# Patient Record
Sex: Female | Born: 1950 | Race: Black or African American | Hispanic: No | Smoking: Never smoker
Health system: Southern US, Community
[De-identification: ages and names within clinical notes are randomized; demographics above are authoritative.]

## PROBLEM LIST (undated history)

## (undated) DIAGNOSIS — I10 Essential (primary) hypertension: Secondary | ICD-10-CM

## (undated) DIAGNOSIS — E119 Type 2 diabetes mellitus without complications: Secondary | ICD-10-CM

## (undated) DIAGNOSIS — G4730 Sleep apnea, unspecified: Secondary | ICD-10-CM

---

## 1999-05-31 HISTORY — PX: BREAST CYST ASPIRATION: SHX578

## 2015-05-31 HISTORY — PX: UMBILICAL HERNIA REPAIR: SHX196

## 2018-06-05 DIAGNOSIS — M5127 Other intervertebral disc displacement, lumbosacral region: Secondary | ICD-10-CM | POA: Diagnosis not present

## 2018-06-05 DIAGNOSIS — M5432 Sciatica, left side: Secondary | ICD-10-CM | POA: Diagnosis not present

## 2018-06-06 DIAGNOSIS — M9905 Segmental and somatic dysfunction of pelvic region: Secondary | ICD-10-CM | POA: Diagnosis not present

## 2018-06-06 DIAGNOSIS — M5432 Sciatica, left side: Secondary | ICD-10-CM | POA: Diagnosis not present

## 2018-06-06 DIAGNOSIS — M9903 Segmental and somatic dysfunction of lumbar region: Secondary | ICD-10-CM | POA: Diagnosis not present

## 2018-06-06 DIAGNOSIS — M5416 Radiculopathy, lumbar region: Secondary | ICD-10-CM | POA: Diagnosis not present

## 2018-06-07 DIAGNOSIS — M5416 Radiculopathy, lumbar region: Secondary | ICD-10-CM | POA: Diagnosis not present

## 2018-06-07 DIAGNOSIS — M5432 Sciatica, left side: Secondary | ICD-10-CM | POA: Diagnosis not present

## 2018-06-07 DIAGNOSIS — M9905 Segmental and somatic dysfunction of pelvic region: Secondary | ICD-10-CM | POA: Diagnosis not present

## 2018-06-07 DIAGNOSIS — M9903 Segmental and somatic dysfunction of lumbar region: Secondary | ICD-10-CM | POA: Diagnosis not present

## 2018-06-08 DIAGNOSIS — M5417 Radiculopathy, lumbosacral region: Secondary | ICD-10-CM | POA: Diagnosis not present

## 2018-06-08 DIAGNOSIS — M9903 Segmental and somatic dysfunction of lumbar region: Secondary | ICD-10-CM | POA: Diagnosis not present

## 2018-06-25 DIAGNOSIS — M6281 Muscle weakness (generalized): Secondary | ICD-10-CM | POA: Diagnosis not present

## 2018-06-25 DIAGNOSIS — M5432 Sciatica, left side: Secondary | ICD-10-CM | POA: Diagnosis not present

## 2018-07-06 DIAGNOSIS — M5432 Sciatica, left side: Secondary | ICD-10-CM | POA: Diagnosis not present

## 2018-07-16 ENCOUNTER — Other Ambulatory Visit: Payer: Self-pay | Admitting: Family Medicine

## 2018-07-16 DIAGNOSIS — E785 Hyperlipidemia, unspecified: Secondary | ICD-10-CM | POA: Diagnosis not present

## 2018-07-16 DIAGNOSIS — Z1231 Encounter for screening mammogram for malignant neoplasm of breast: Secondary | ICD-10-CM

## 2018-07-16 DIAGNOSIS — Z23 Encounter for immunization: Secondary | ICD-10-CM | POA: Diagnosis not present

## 2018-07-16 DIAGNOSIS — Z862 Personal history of diseases of the blood and blood-forming organs and certain disorders involving the immune mechanism: Secondary | ICD-10-CM | POA: Diagnosis not present

## 2018-07-16 DIAGNOSIS — Z0000 Encounter for general adult medical examination without abnormal findings: Secondary | ICD-10-CM | POA: Diagnosis not present

## 2018-07-16 DIAGNOSIS — E1169 Type 2 diabetes mellitus with other specified complication: Secondary | ICD-10-CM | POA: Diagnosis not present

## 2018-07-16 DIAGNOSIS — M5432 Sciatica, left side: Secondary | ICD-10-CM | POA: Diagnosis not present

## 2018-07-16 DIAGNOSIS — E7800 Pure hypercholesterolemia, unspecified: Secondary | ICD-10-CM | POA: Diagnosis not present

## 2018-07-16 DIAGNOSIS — I10 Essential (primary) hypertension: Secondary | ICD-10-CM | POA: Diagnosis not present

## 2018-07-16 DIAGNOSIS — M6281 Muscle weakness (generalized): Secondary | ICD-10-CM | POA: Diagnosis not present

## 2018-07-24 DIAGNOSIS — M6281 Muscle weakness (generalized): Secondary | ICD-10-CM | POA: Diagnosis not present

## 2018-07-24 DIAGNOSIS — Z780 Asymptomatic menopausal state: Secondary | ICD-10-CM | POA: Diagnosis not present

## 2018-07-24 DIAGNOSIS — M5432 Sciatica, left side: Secondary | ICD-10-CM | POA: Diagnosis not present

## 2018-07-31 ENCOUNTER — Encounter: Payer: Self-pay | Admitting: Radiology

## 2018-07-31 ENCOUNTER — Ambulatory Visit
Admission: RE | Admit: 2018-07-31 | Discharge: 2018-07-31 | Disposition: A | Discharge status: Home / Self Care | Payer: Medicare HMO | Source: Ambulatory Visit | Attending: Family Medicine | Admitting: Family Medicine

## 2018-07-31 DIAGNOSIS — M6281 Muscle weakness (generalized): Secondary | ICD-10-CM | POA: Diagnosis not present

## 2018-07-31 DIAGNOSIS — M5432 Sciatica, left side: Secondary | ICD-10-CM | POA: Diagnosis not present

## 2018-07-31 DIAGNOSIS — Z1231 Encounter for screening mammogram for malignant neoplasm of breast: Secondary | ICD-10-CM | POA: Insufficient documentation

## 2018-07-31 DIAGNOSIS — L299 Pruritus, unspecified: Secondary | ICD-10-CM | POA: Diagnosis not present

## 2018-07-31 IMAGING — MG DIGITAL SCREENING BILATERAL MAMMOGRAM WITH TOMO AND CAD
6 of 10 series · 6 of 30 positions shown · non-contrast
Comparison: Previous exam(s).

CLINICAL DATA: Screening.

EXAM:
DIGITAL SCREENING BILATERAL MAMMOGRAM WITH TOMO AND CAD

[L MLO synth-2D (1 of 2)]
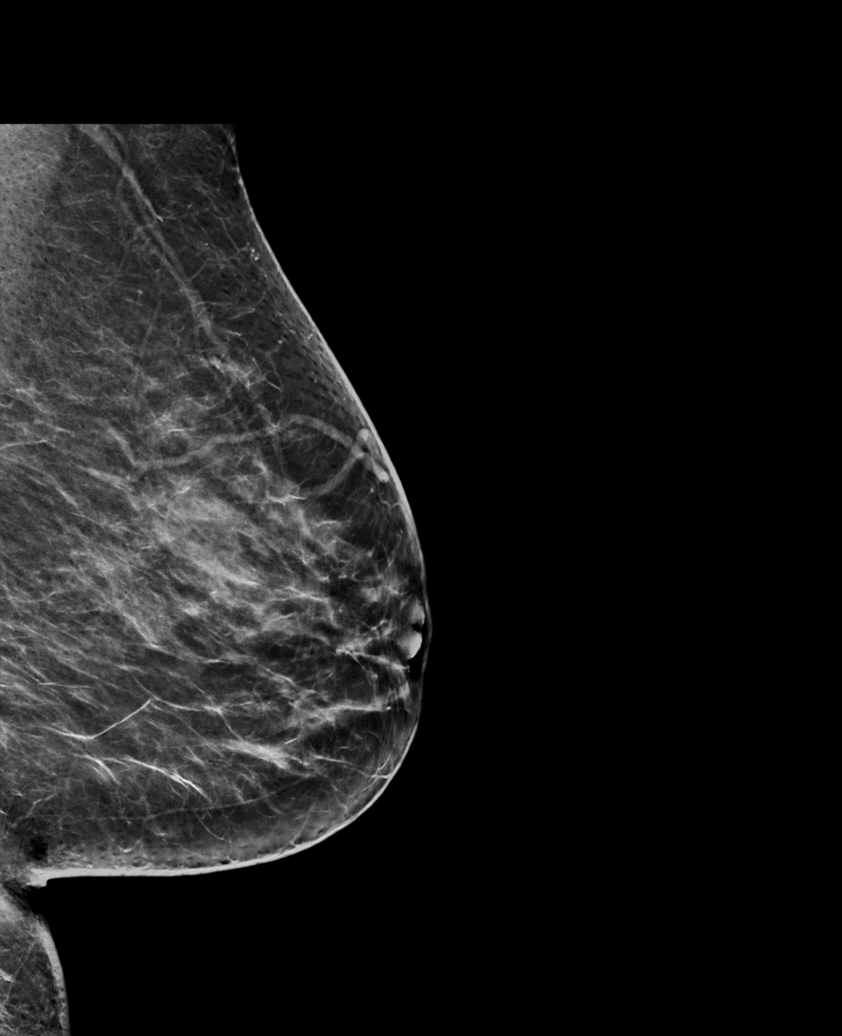

[L CC synth-2D]
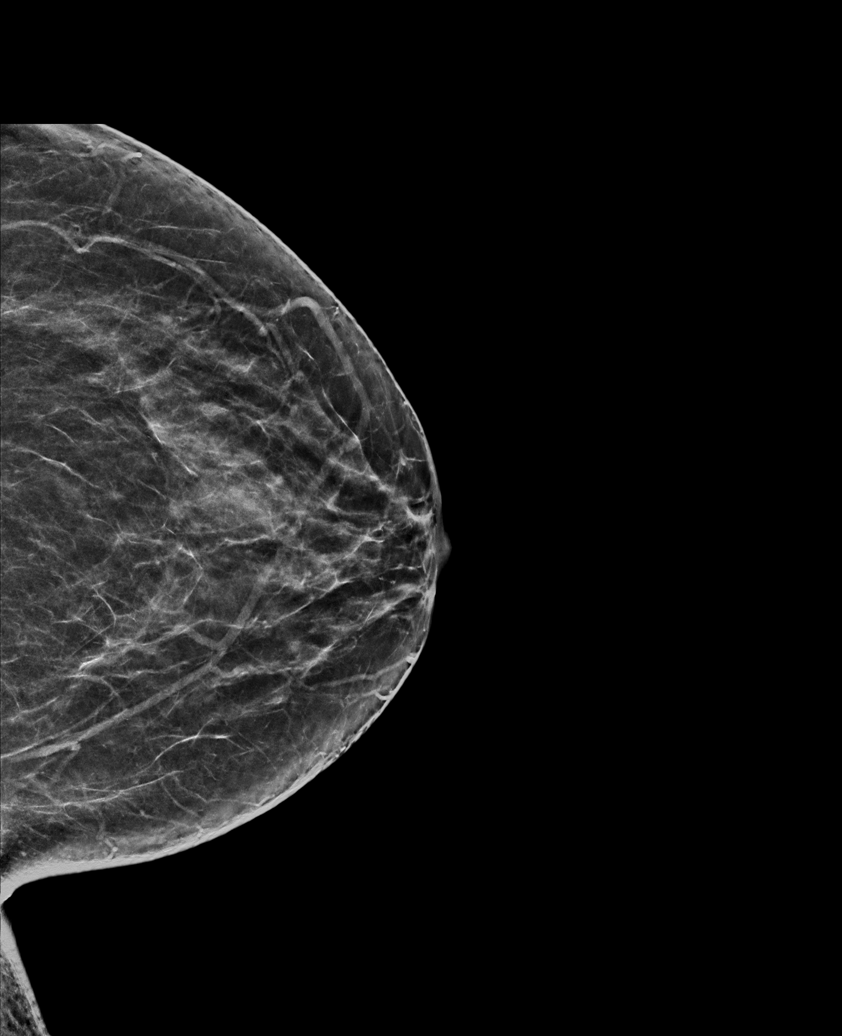

[L MLO synth-2D (2 of 2)]
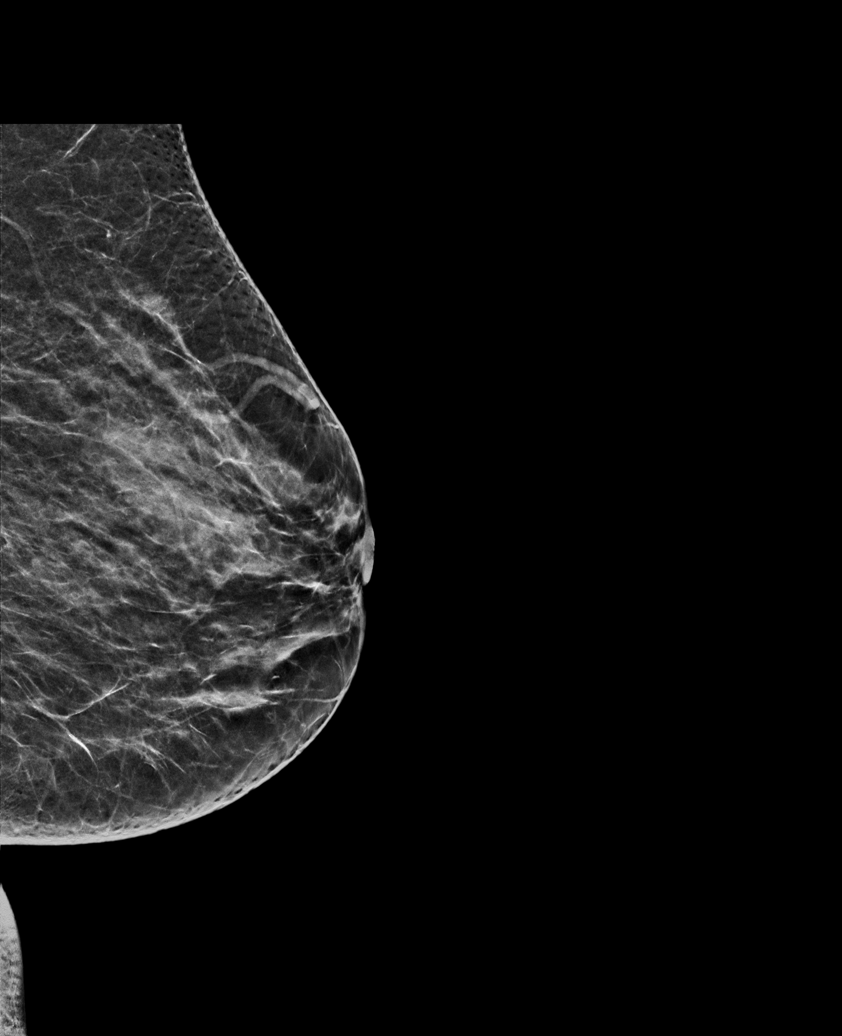

[R MLO synth-2D]
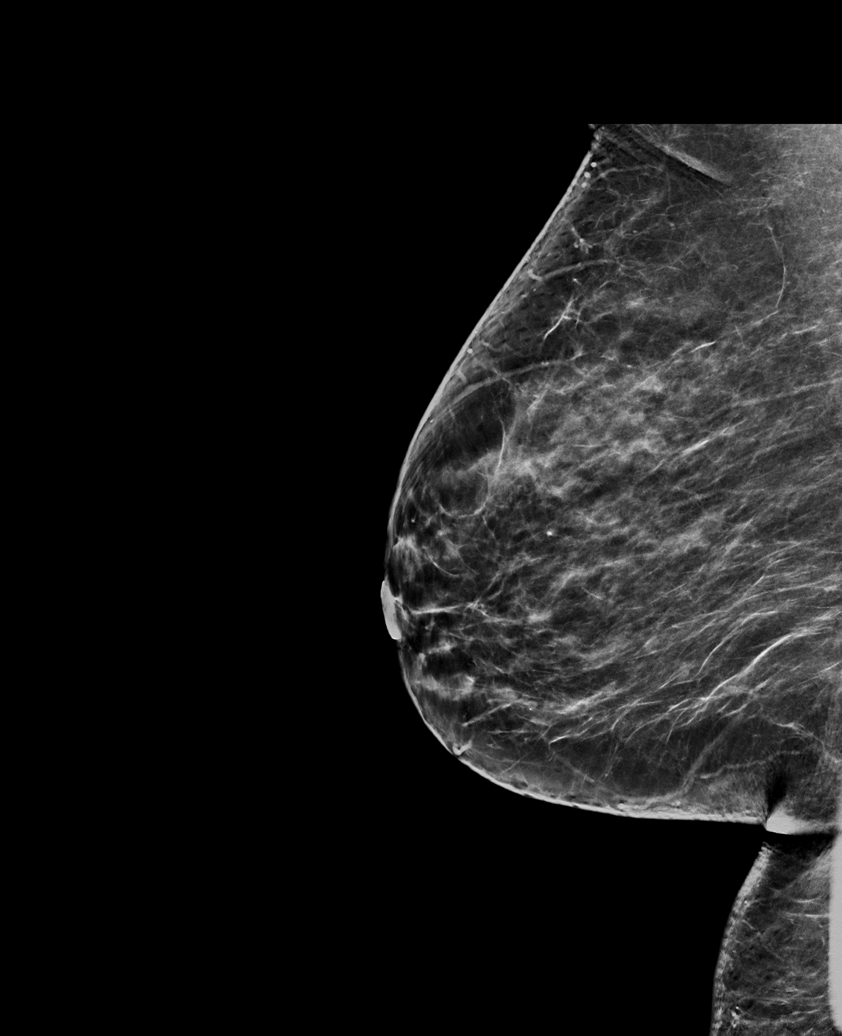

[R CC synth-2D]
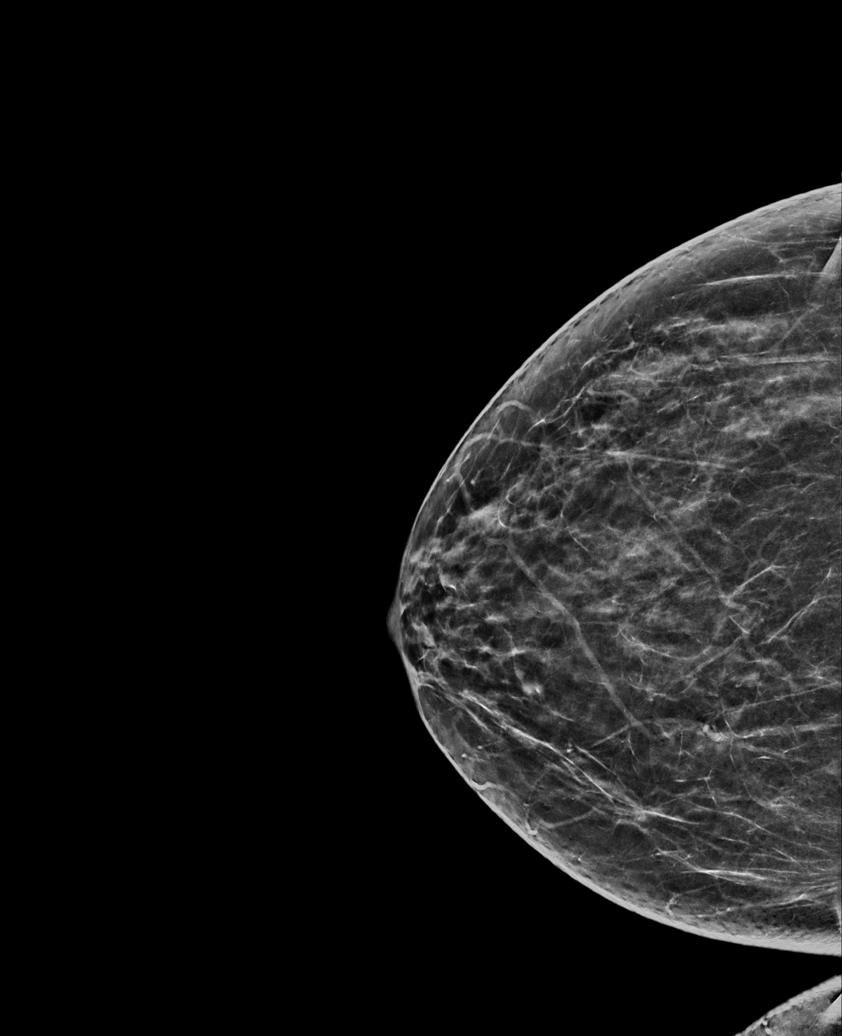

[L CC tomo · tomo slice 31/61.0]
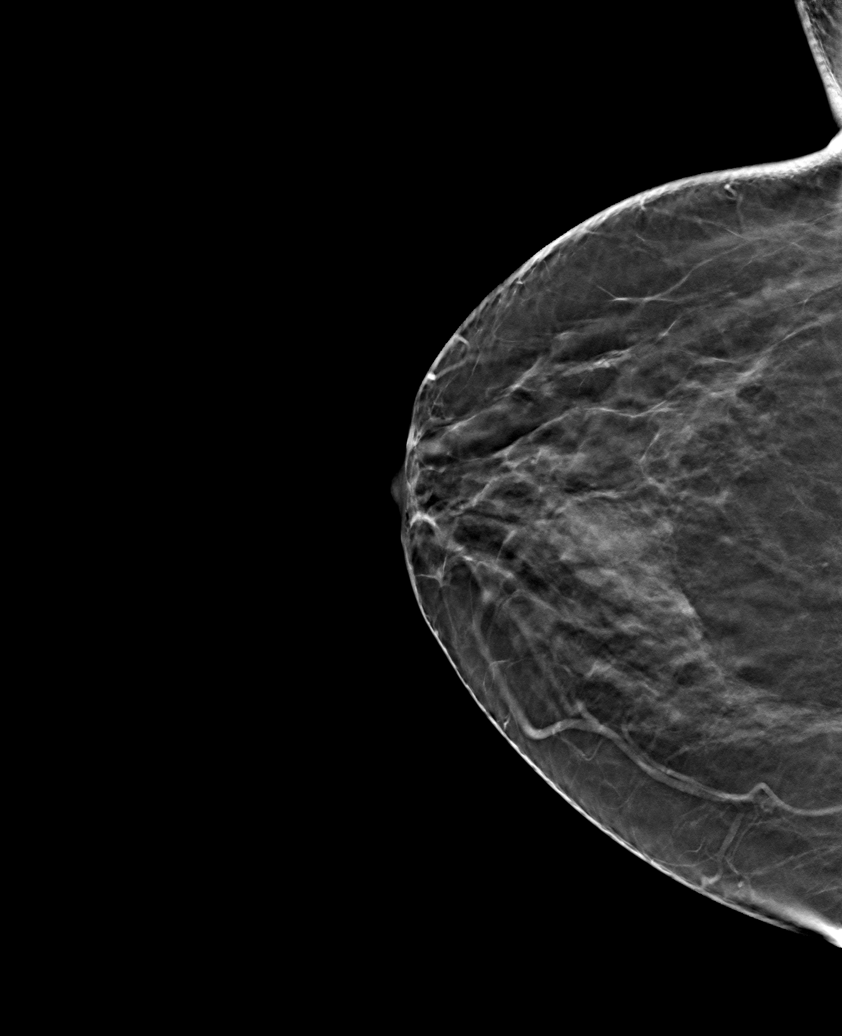

[6 of 30 positions shown; findings below may reference images not displayed]

ACR Breast Density Category c: The breast tissue is heterogeneously
dense, which may obscure small masses.
FINDINGS: There are no findings suspicious for malignancy. Images were
processed with CAD.
IMPRESSION: No mammographic evidence of malignancy. A result letter of this
screening mammogram will be mailed directly to the patient.

RECOMMENDATION:
Screening mammogram in one year. (Code:[5V])

BI-RADS CATEGORY  1: Negative.

## 2018-10-18 DIAGNOSIS — H40003 Preglaucoma, unspecified, bilateral: Secondary | ICD-10-CM | POA: Diagnosis not present

## 2018-11-07 DIAGNOSIS — E7800 Pure hypercholesterolemia, unspecified: Secondary | ICD-10-CM | POA: Diagnosis not present

## 2018-11-07 DIAGNOSIS — Z862 Personal history of diseases of the blood and blood-forming organs and certain disorders involving the immune mechanism: Secondary | ICD-10-CM | POA: Diagnosis not present

## 2018-11-07 DIAGNOSIS — I10 Essential (primary) hypertension: Secondary | ICD-10-CM | POA: Diagnosis not present

## 2018-11-07 DIAGNOSIS — E1169 Type 2 diabetes mellitus with other specified complication: Secondary | ICD-10-CM | POA: Diagnosis not present

## 2018-11-07 DIAGNOSIS — E785 Hyperlipidemia, unspecified: Secondary | ICD-10-CM | POA: Diagnosis not present

## 2018-11-14 DIAGNOSIS — E785 Hyperlipidemia, unspecified: Secondary | ICD-10-CM | POA: Diagnosis not present

## 2018-11-14 DIAGNOSIS — I10 Essential (primary) hypertension: Secondary | ICD-10-CM | POA: Diagnosis not present

## 2018-11-14 DIAGNOSIS — Z862 Personal history of diseases of the blood and blood-forming organs and certain disorders involving the immune mechanism: Secondary | ICD-10-CM | POA: Diagnosis not present

## 2018-11-14 DIAGNOSIS — E1169 Type 2 diabetes mellitus with other specified complication: Secondary | ICD-10-CM | POA: Diagnosis not present

## 2018-11-14 DIAGNOSIS — E7800 Pure hypercholesterolemia, unspecified: Secondary | ICD-10-CM | POA: Diagnosis not present

## 2019-01-28 DIAGNOSIS — E1169 Type 2 diabetes mellitus with other specified complication: Secondary | ICD-10-CM | POA: Diagnosis not present

## 2019-01-28 DIAGNOSIS — M5489 Other dorsalgia: Secondary | ICD-10-CM | POA: Diagnosis not present

## 2019-01-28 DIAGNOSIS — E785 Hyperlipidemia, unspecified: Secondary | ICD-10-CM | POA: Diagnosis not present

## 2019-01-28 DIAGNOSIS — M546 Pain in thoracic spine: Secondary | ICD-10-CM | POA: Diagnosis not present

## 2019-01-28 DIAGNOSIS — K625 Hemorrhage of anus and rectum: Secondary | ICD-10-CM | POA: Diagnosis not present

## 2019-02-06 DIAGNOSIS — K625 Hemorrhage of anus and rectum: Secondary | ICD-10-CM | POA: Diagnosis not present

## 2019-02-06 DIAGNOSIS — Z01812 Encounter for preprocedural laboratory examination: Secondary | ICD-10-CM | POA: Diagnosis not present

## 2019-03-11 DIAGNOSIS — D709 Neutropenia, unspecified: Secondary | ICD-10-CM | POA: Diagnosis not present

## 2019-04-18 DIAGNOSIS — H40003 Preglaucoma, unspecified, bilateral: Secondary | ICD-10-CM | POA: Diagnosis not present

## 2019-05-20 DIAGNOSIS — Z862 Personal history of diseases of the blood and blood-forming organs and certain disorders involving the immune mechanism: Secondary | ICD-10-CM | POA: Diagnosis not present

## 2019-05-20 DIAGNOSIS — E785 Hyperlipidemia, unspecified: Secondary | ICD-10-CM | POA: Diagnosis not present

## 2019-05-20 DIAGNOSIS — E1169 Type 2 diabetes mellitus with other specified complication: Secondary | ICD-10-CM | POA: Diagnosis not present

## 2019-05-20 DIAGNOSIS — E7800 Pure hypercholesterolemia, unspecified: Secondary | ICD-10-CM | POA: Diagnosis not present

## 2019-05-20 DIAGNOSIS — I10 Essential (primary) hypertension: Secondary | ICD-10-CM | POA: Diagnosis not present

## 2019-05-29 DIAGNOSIS — Z87891 Personal history of nicotine dependence: Secondary | ICD-10-CM | POA: Diagnosis not present

## 2019-05-29 DIAGNOSIS — Z0000 Encounter for general adult medical examination without abnormal findings: Secondary | ICD-10-CM | POA: Diagnosis not present

## 2019-06-27 ENCOUNTER — Other Ambulatory Visit: Payer: Self-pay | Admitting: Family Medicine

## 2019-06-27 DIAGNOSIS — Z1231 Encounter for screening mammogram for malignant neoplasm of breast: Secondary | ICD-10-CM

## 2019-08-01 ENCOUNTER — Ambulatory Visit
Admission: RE | Admit: 2019-08-01 | Discharge: 2019-08-01 | Disposition: A | Discharge status: Home / Self Care | Payer: Medicare HMO | Source: Ambulatory Visit | Attending: Family Medicine | Admitting: Family Medicine

## 2019-08-01 DIAGNOSIS — Z1231 Encounter for screening mammogram for malignant neoplasm of breast: Secondary | ICD-10-CM | POA: Diagnosis not present

## 2019-08-01 IMAGING — MG DIGITAL SCREENING BILAT W/ TOMO W/ CAD
8 series · 9 of 24 positions shown · non-contrast
Comparison: Previous exam(s).

CLINICAL DATA: Screening.

EXAM:
DIGITAL SCREENING BILATERAL MAMMOGRAM WITH TOMO AND CAD

[L CC synth-2D]
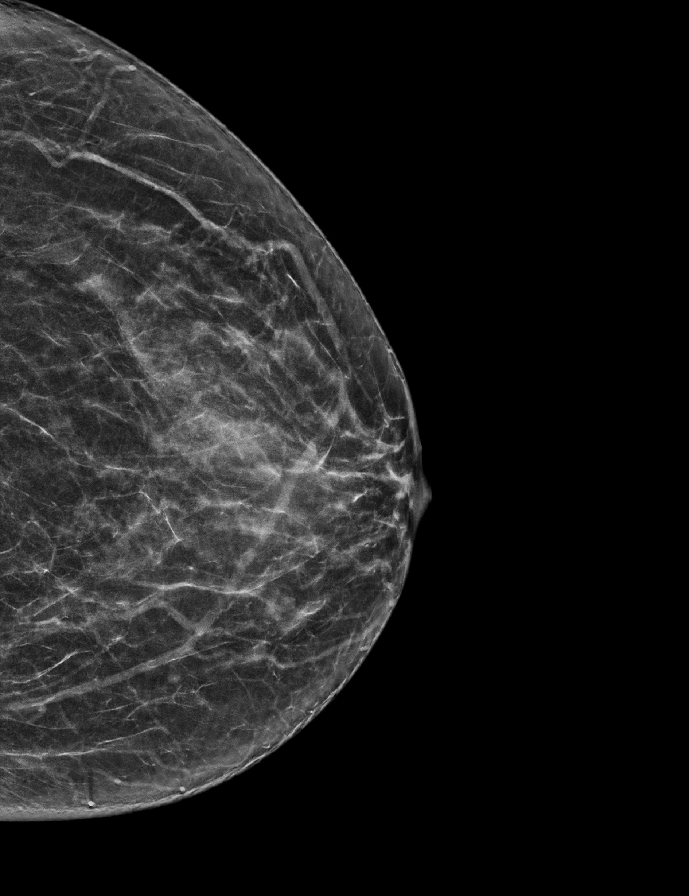

[R CC synth-2D]
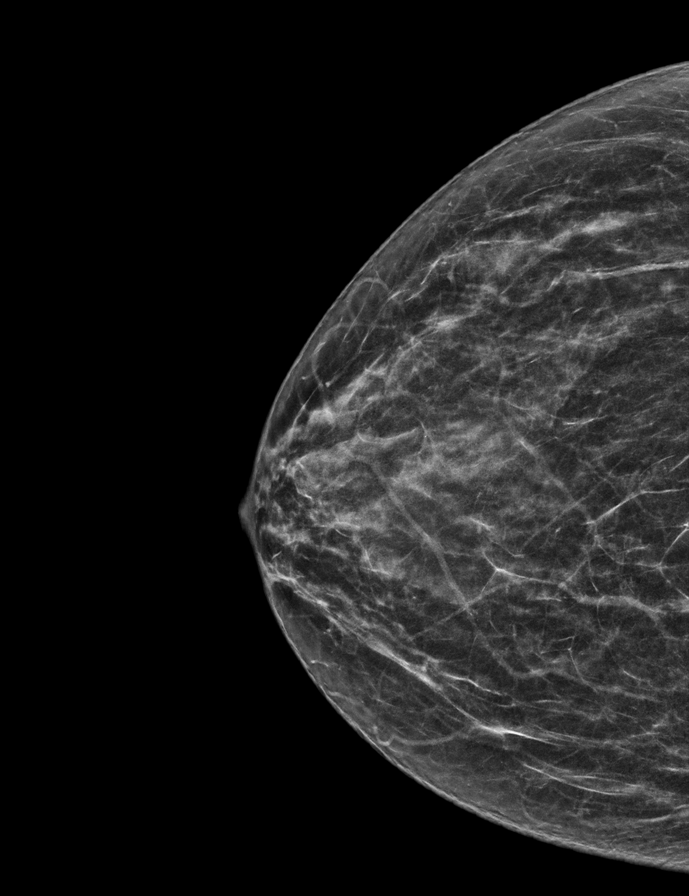

[R MLO synth-2D]
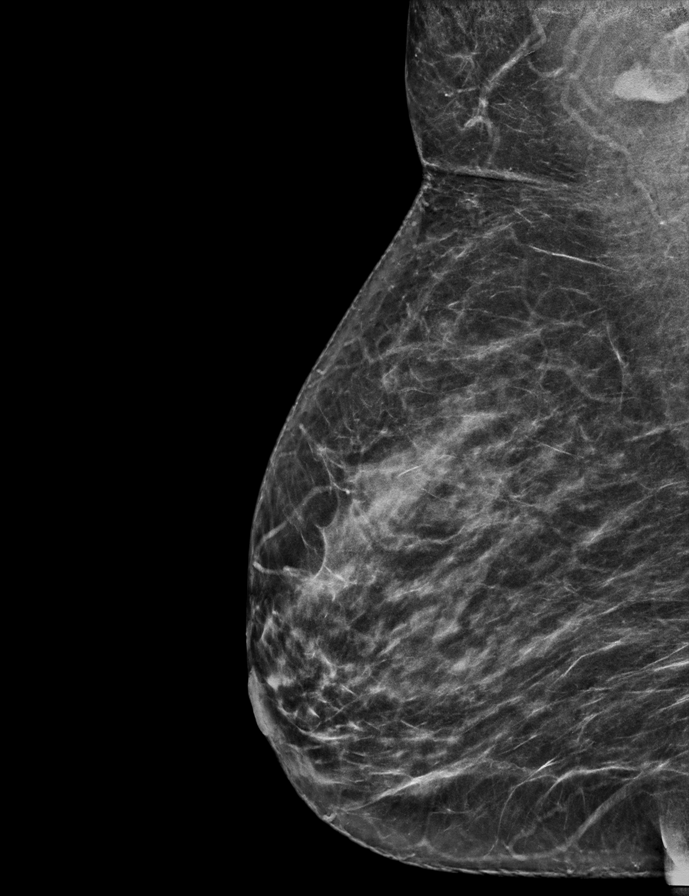

[L MLO synth-2D]
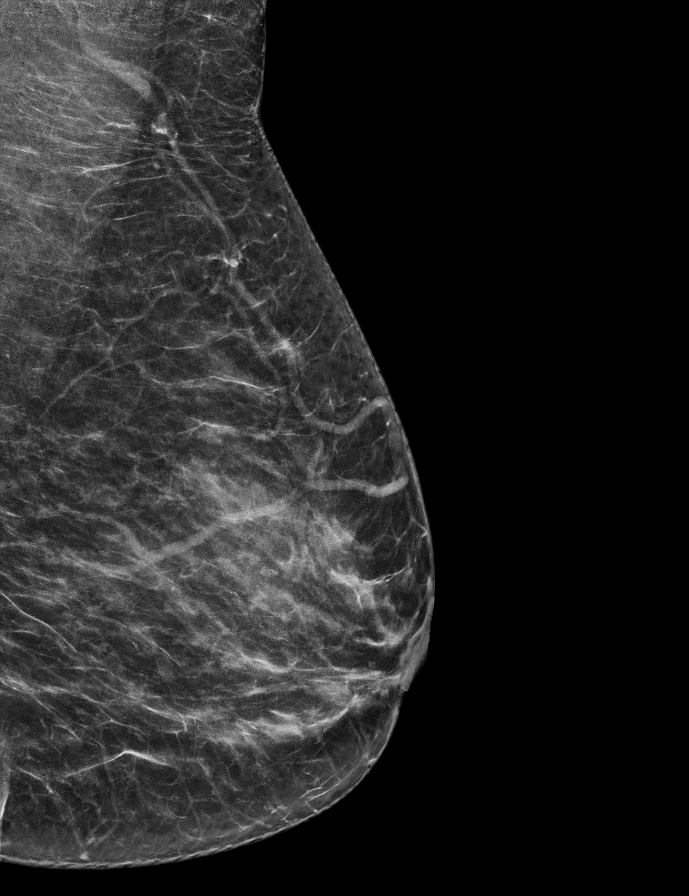

[L CC tomo · 2 of 55 frames shown]
[frame 18/55]
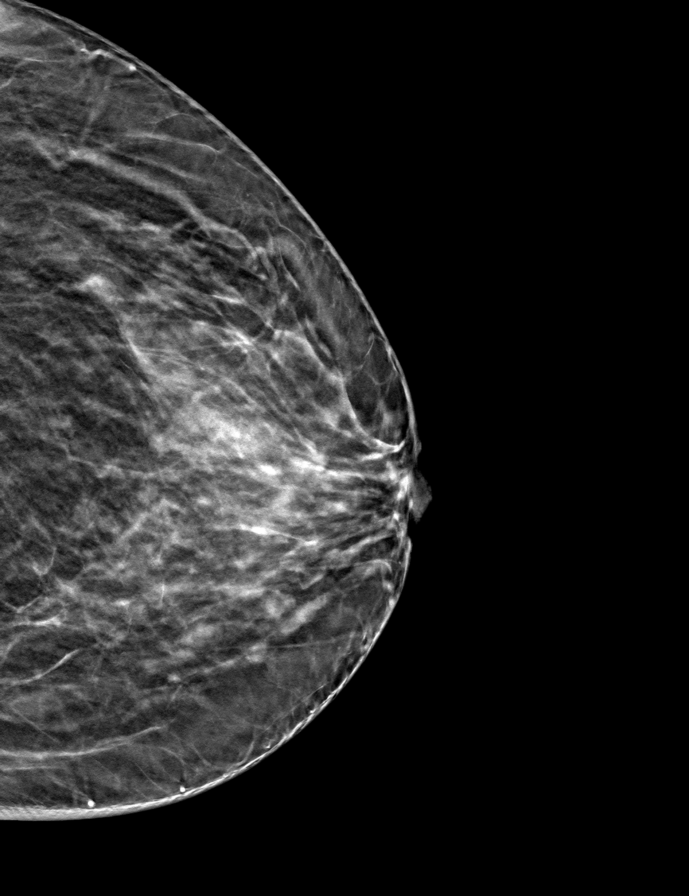
[frame 28/55]
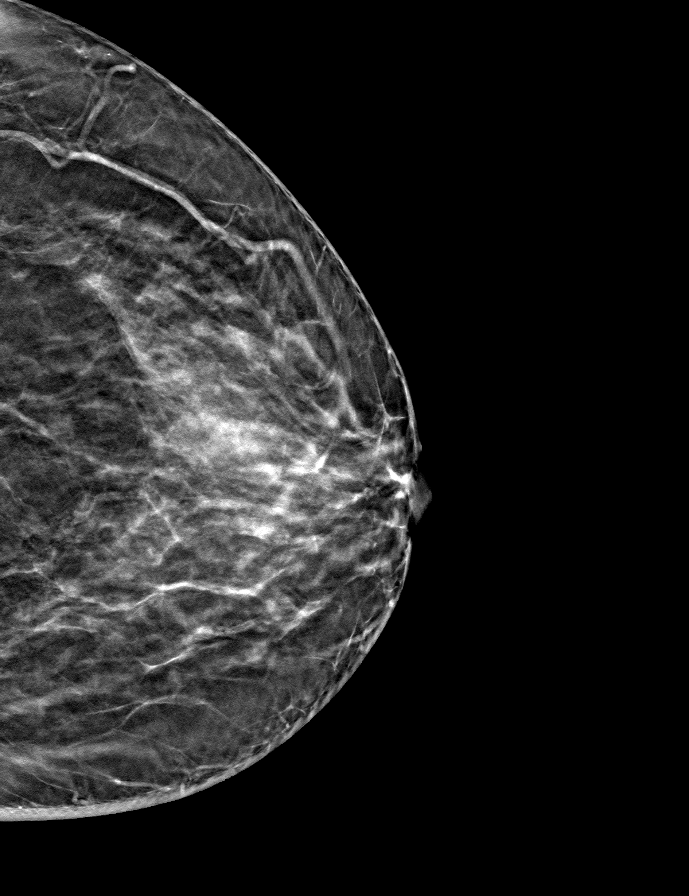

[R CC tomo · tomo slice 27/52.0]
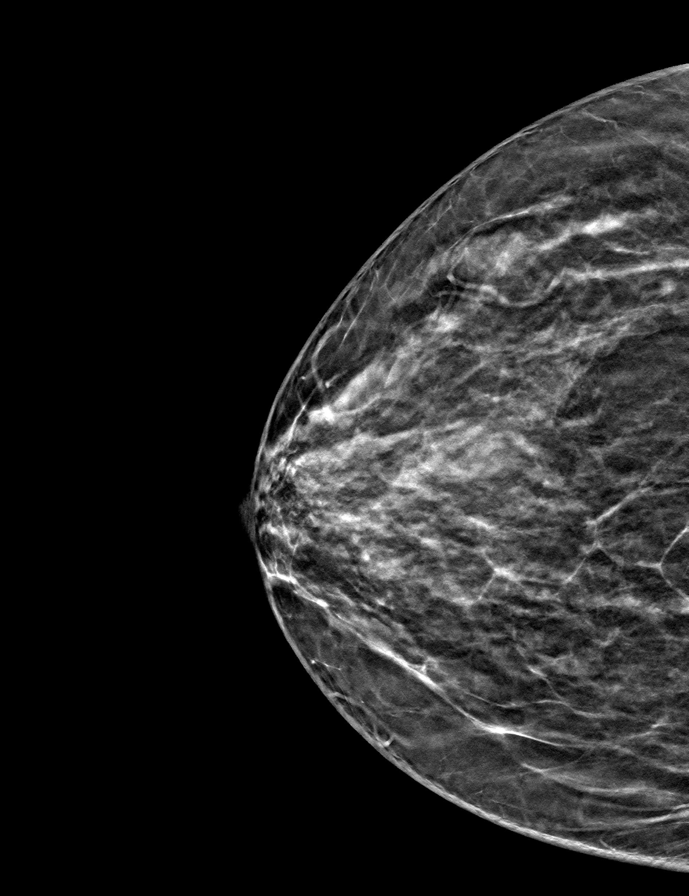

[L MLO tomo · tomo slice 30/59.0]
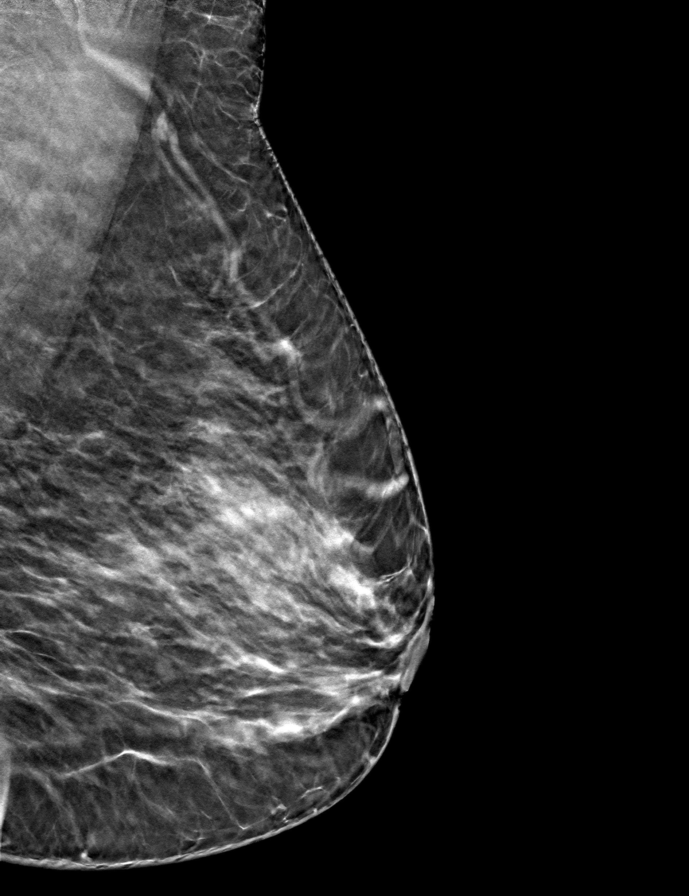

[R MLO tomo · tomo slice 31/62.0]
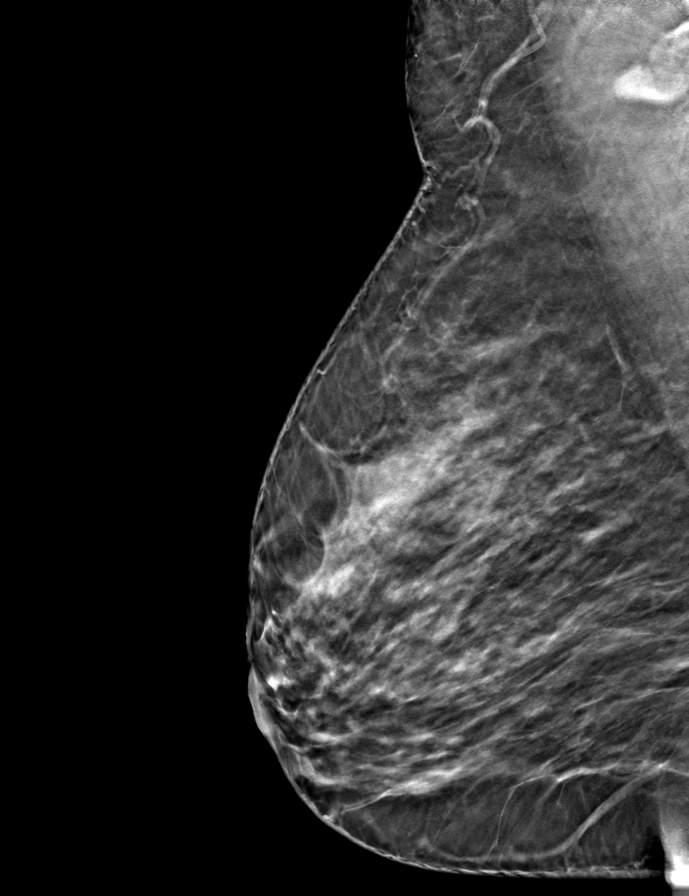

[9 of 24 positions shown; findings below may reference images not displayed]

ACR Breast Density Category c: The breast tissue is heterogeneously
dense, which may obscure small masses.
FINDINGS: There are no findings suspicious for malignancy. Images were
processed with CAD.
IMPRESSION: No mammographic evidence of malignancy. A result letter of this
screening mammogram will be mailed directly to the patient.

RECOMMENDATION:
Screening mammogram in one year. (Code:[5V])

BI-RADS CATEGORY  1: Negative.

## 2019-09-23 DIAGNOSIS — Z0100 Encounter for examination of eyes and vision without abnormal findings: Secondary | ICD-10-CM | POA: Diagnosis not present

## 2019-10-08 DIAGNOSIS — H40003 Preglaucoma, unspecified, bilateral: Secondary | ICD-10-CM | POA: Diagnosis not present

## 2019-10-15 DIAGNOSIS — H40003 Preglaucoma, unspecified, bilateral: Secondary | ICD-10-CM | POA: Diagnosis not present

## 2019-11-21 DIAGNOSIS — E7800 Pure hypercholesterolemia, unspecified: Secondary | ICD-10-CM | POA: Diagnosis not present

## 2019-11-21 DIAGNOSIS — I10 Essential (primary) hypertension: Secondary | ICD-10-CM | POA: Diagnosis not present

## 2019-11-21 DIAGNOSIS — E1169 Type 2 diabetes mellitus with other specified complication: Secondary | ICD-10-CM | POA: Diagnosis not present

## 2019-11-21 DIAGNOSIS — E785 Hyperlipidemia, unspecified: Secondary | ICD-10-CM | POA: Diagnosis not present

## 2019-11-28 DIAGNOSIS — E7800 Pure hypercholesterolemia, unspecified: Secondary | ICD-10-CM | POA: Diagnosis not present

## 2019-11-28 DIAGNOSIS — M545 Low back pain: Secondary | ICD-10-CM | POA: Diagnosis not present

## 2019-11-28 DIAGNOSIS — G8929 Other chronic pain: Secondary | ICD-10-CM | POA: Diagnosis not present

## 2019-11-28 DIAGNOSIS — E1169 Type 2 diabetes mellitus with other specified complication: Secondary | ICD-10-CM | POA: Diagnosis not present

## 2019-11-28 DIAGNOSIS — M2012 Hallux valgus (acquired), left foot: Secondary | ICD-10-CM | POA: Diagnosis not present

## 2019-11-28 DIAGNOSIS — I10 Essential (primary) hypertension: Secondary | ICD-10-CM | POA: Diagnosis not present

## 2019-11-28 DIAGNOSIS — E119 Type 2 diabetes mellitus without complications: Secondary | ICD-10-CM | POA: Diagnosis not present

## 2019-11-28 DIAGNOSIS — L659 Nonscarring hair loss, unspecified: Secondary | ICD-10-CM | POA: Diagnosis not present

## 2019-11-28 DIAGNOSIS — R1033 Periumbilical pain: Secondary | ICD-10-CM | POA: Diagnosis not present

## 2019-11-28 DIAGNOSIS — M778 Other enthesopathies, not elsewhere classified: Secondary | ICD-10-CM | POA: Diagnosis not present

## 2019-11-28 DIAGNOSIS — D649 Anemia, unspecified: Secondary | ICD-10-CM | POA: Diagnosis not present

## 2019-11-28 DIAGNOSIS — D3613 Benign neoplasm of peripheral nerves and autonomic nervous system of lower limb, including hip: Secondary | ICD-10-CM | POA: Diagnosis not present

## 2019-11-28 DIAGNOSIS — D72819 Decreased white blood cell count, unspecified: Secondary | ICD-10-CM | POA: Diagnosis not present

## 2019-11-28 DIAGNOSIS — M898X9 Other specified disorders of bone, unspecified site: Secondary | ICD-10-CM | POA: Diagnosis not present

## 2019-12-12 ENCOUNTER — Other Ambulatory Visit (HOSPITAL_COMMUNITY): Payer: Self-pay | Admitting: General Surgery

## 2019-12-12 ENCOUNTER — Other Ambulatory Visit: Payer: Self-pay | Admitting: General Surgery

## 2019-12-12 DIAGNOSIS — Z8719 Personal history of other diseases of the digestive system: Secondary | ICD-10-CM | POA: Diagnosis not present

## 2019-12-12 DIAGNOSIS — R1033 Periumbilical pain: Secondary | ICD-10-CM | POA: Diagnosis not present

## 2019-12-12 DIAGNOSIS — Z9889 Other specified postprocedural states: Secondary | ICD-10-CM | POA: Diagnosis not present

## 2019-12-26 ENCOUNTER — Other Ambulatory Visit: Payer: Self-pay

## 2019-12-26 ENCOUNTER — Ambulatory Visit
Admission: RE | Admit: 2019-12-26 | Discharge: 2019-12-26 | Disposition: A | Discharge status: Home / Self Care | Payer: Medicare HMO | Source: Ambulatory Visit | Attending: General Surgery | Admitting: General Surgery

## 2019-12-26 DIAGNOSIS — R1033 Periumbilical pain: Secondary | ICD-10-CM | POA: Insufficient documentation

## 2019-12-26 DIAGNOSIS — K449 Diaphragmatic hernia without obstruction or gangrene: Secondary | ICD-10-CM | POA: Diagnosis not present

## 2019-12-26 HISTORY — DX: Essential (primary) hypertension: I10

## 2019-12-26 LAB — POCT I-STAT CREATININE: Creatinine, Ser: 0.7 mg/dL (ref 0.44–1.00)

## 2019-12-26 IMAGING — CT CT ABD-PELV W/ CM
2 of 5 series · 16 of 46 positions shown, 18 images · IV contrast (omnipaque)
Comparison: None.

CLINICAL DATA: Periumbilical abdominal pain

EXAM:
CT ABDOMEN AND PELVIS WITH CONTRAST
TECHNIQUE: Multidetector CT imaging of the abdomen and pelvis was performed
using the standard protocol following bolus administration of
intravenous contrast.
CONTRAST:  100mL OMNIPAQUE IOHEXOL 300 MG/ML  SOLN

[Series 2: abd pelvis 5.00 · axial · 0.65mm/px · z∈[+1326,+1741]mm · 13 of 93 slices shown, 15 images]
[im 5/93  soft-tissue]
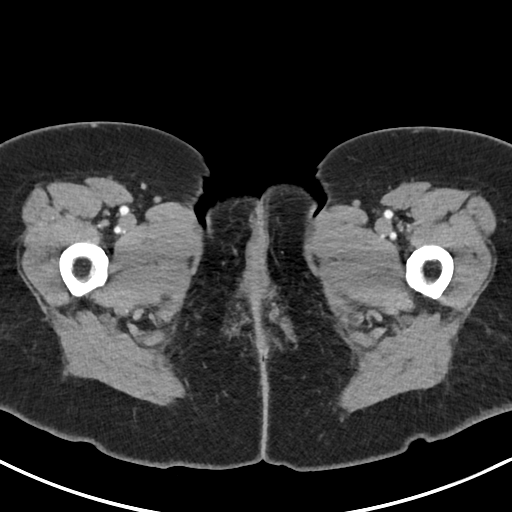
[im 5/93  bone]
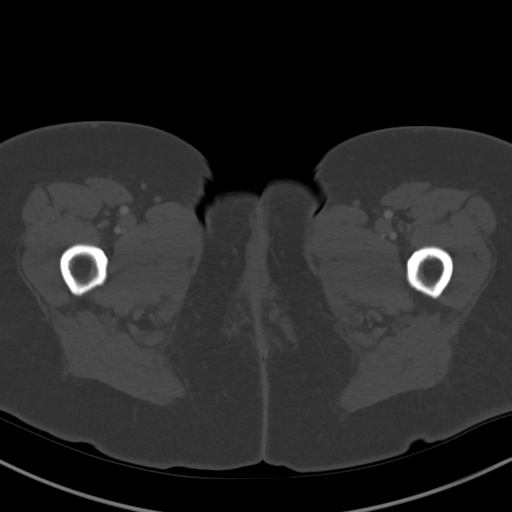
[im 15/93  soft-tissue]
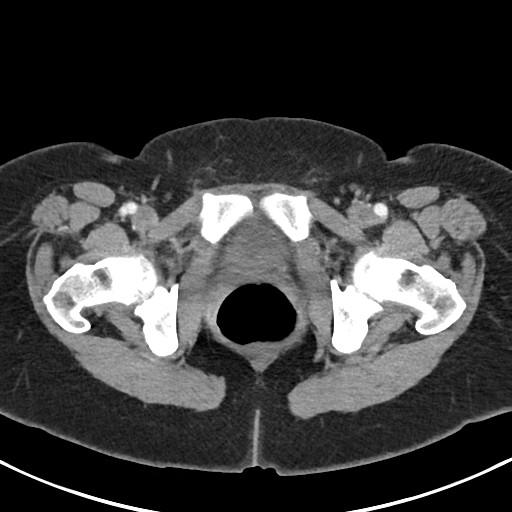
[im 20/93  soft-tissue]
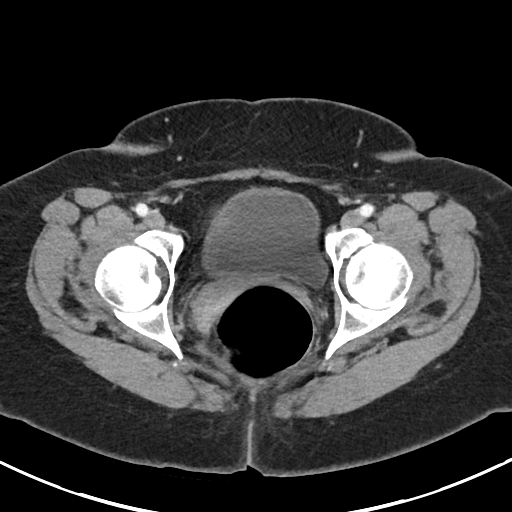
[im 25/93  soft-tissue]
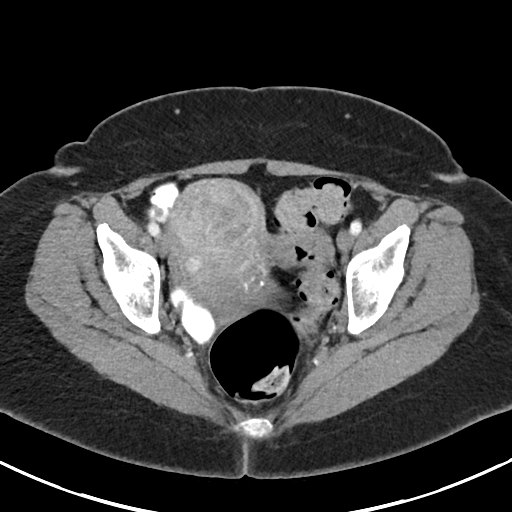
[im 34/93  soft-tissue]
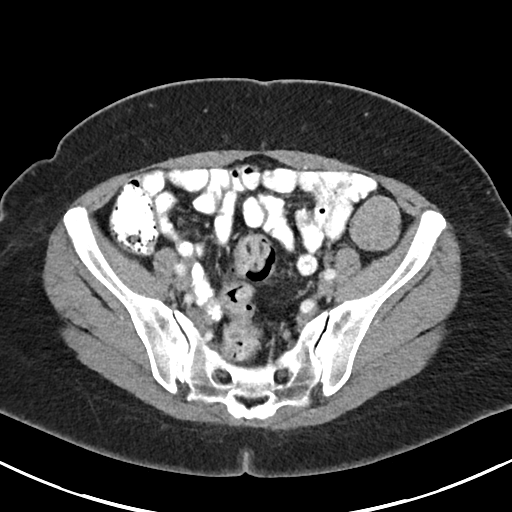
[im 39/93  soft-tissue]
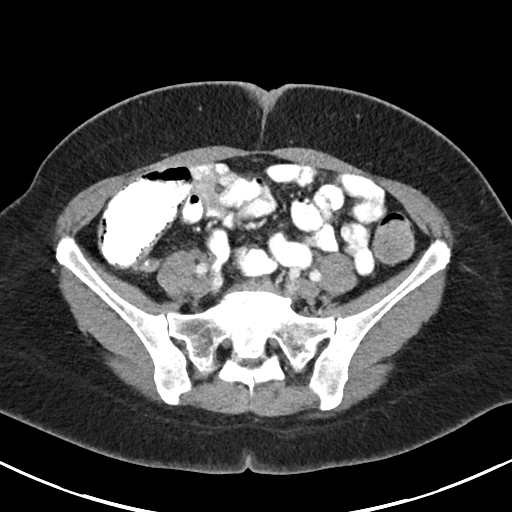
[im 49/93  soft-tissue]
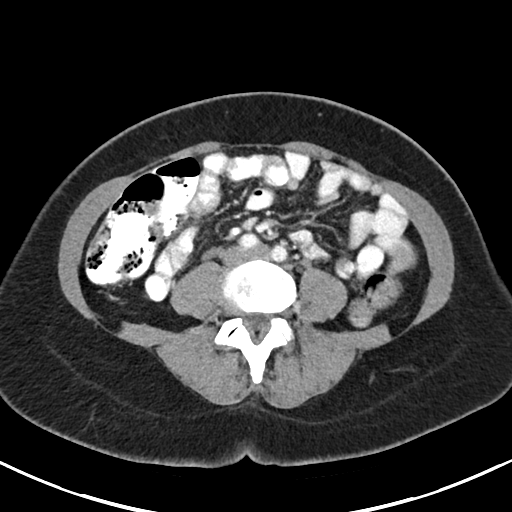
[im 54/93  soft-tissue]
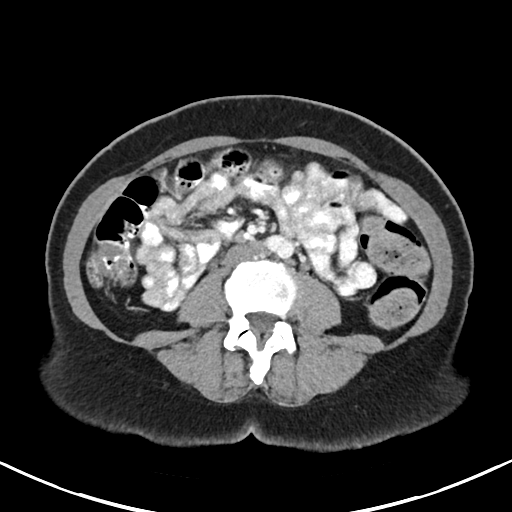
[im 59/93  soft-tissue]
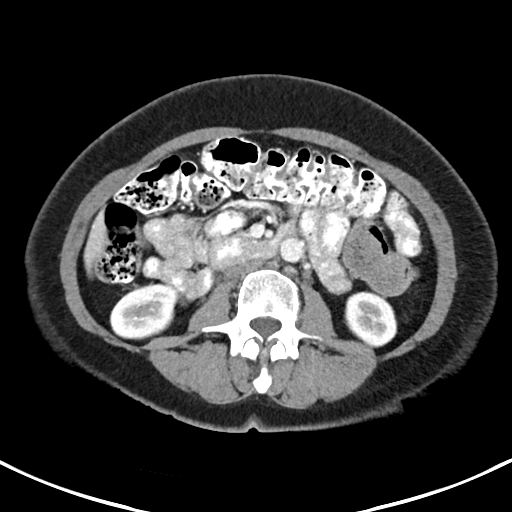
[im 59/93  bone]
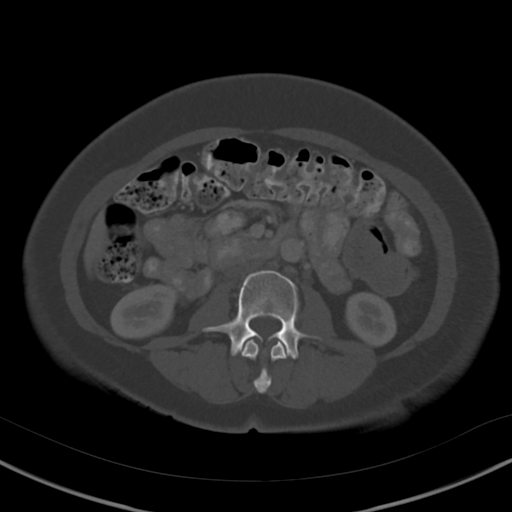
[im 68/93  soft-tissue]
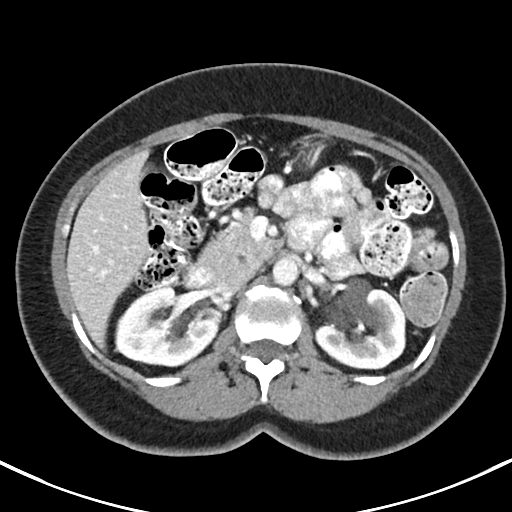
[im 73/93  soft-tissue]
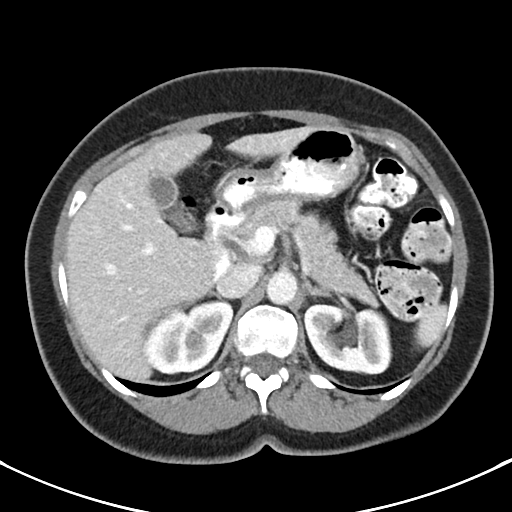
[im 78/93  soft-tissue]
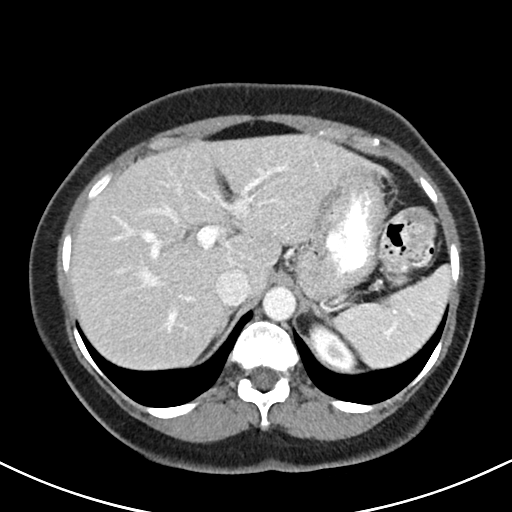
[im 88/93  soft-tissue]
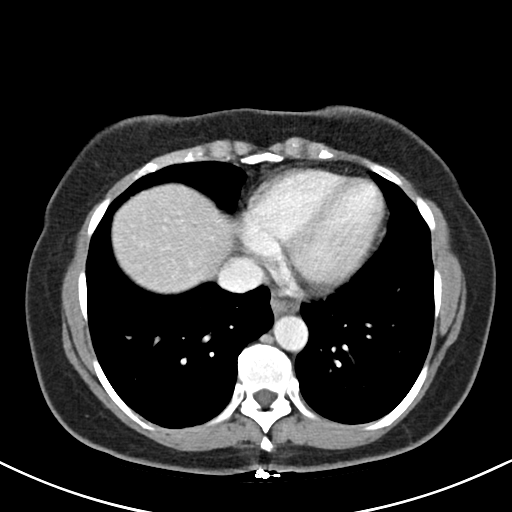

[Series 4: coronals abd pelvis 2.00 cor · coronal · 0.65mm/px · 3 of 137 slices shown]
[im 46/137  soft-tissue]
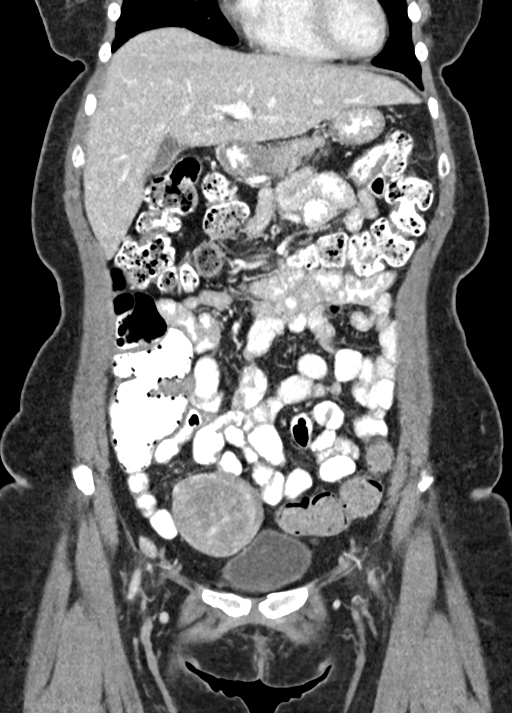
[im 61/137  soft-tissue]
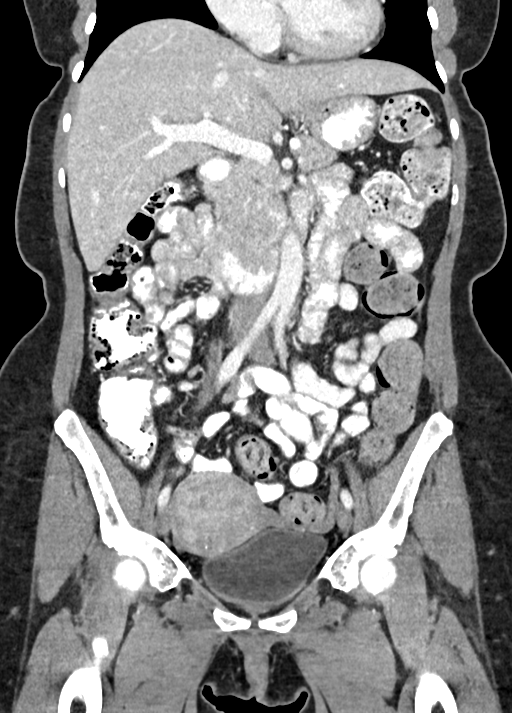
[im 76/137  soft-tissue]
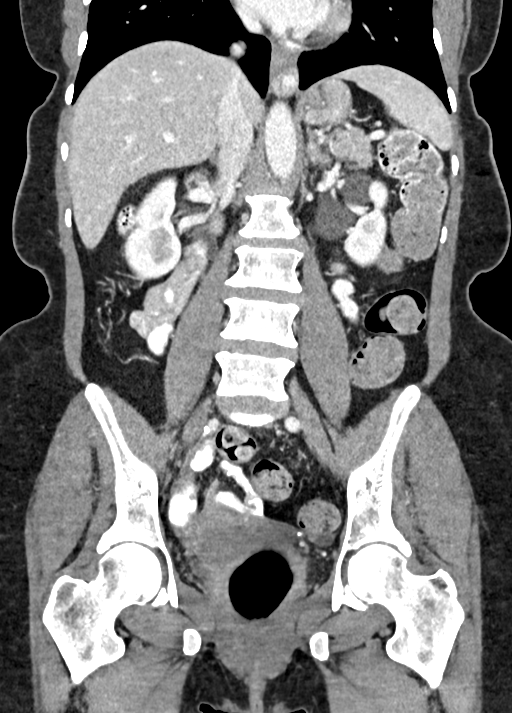

[16 of 46 positions shown; findings below may reference images not displayed]

FINDINGS: Lower chest: No acute abnormality.

Hepatobiliary: No focal liver abnormality is seen. No gallstones,
gallbladder wall thickening, or biliary dilatation.

Pancreas: Unremarkable.

Spleen: Unremarkable.

Adrenals/Urinary Tract: Right adrenals are unremarkable. Bilateral
hydronephrosis. Ureters are mildly prominent. There are no
obstructing calculi. Bladder is unremarkable.

Stomach/Bowel: Small hiatal hernia. Circumferential wall thickening
at the gastroesophageal junction. Bowel is normal in caliber.

Vascular/Lymphatic: Minimal aortic atherosclerosis. No enlarged
lymph nodes identified.

Reproductive: Heterogeneous, likely fibroid uterus. No adnexal mass.

Other: Abdominal wall is unremarkable. Specifically, there is no
hernia identified in the paraumbilical region. No ascites.

Musculoskeletal: No acute osseous abnormality.
IMPRESSION: No significant abnormality in the periumbilical region.

Circumferential wall thickening at the gastroesophageal junction.
This may be related to underdistention, but an upper GI fluoroscopy
study is suggested. A small hiatal hernia is also present.

Bilateral hydronephrosis. Ureters appear mildly prominent. There are
no obstructing calculi.

Heterogeneous, likely fibroid uterus.

## 2019-12-26 MED ORDER — IOHEXOL 300 MG/ML  SOLN
100.0000 mL | Freq: Once | INTRAMUSCULAR | Status: AC | PRN
  Administered 2019-12-26: 100 mL via INTRAVENOUS

## 2020-01-02 DIAGNOSIS — G5792 Unspecified mononeuropathy of left lower limb: Secondary | ICD-10-CM | POA: Diagnosis not present

## 2020-01-02 DIAGNOSIS — E119 Type 2 diabetes mellitus without complications: Secondary | ICD-10-CM | POA: Diagnosis not present

## 2020-02-06 DIAGNOSIS — D259 Leiomyoma of uterus, unspecified: Secondary | ICD-10-CM | POA: Diagnosis not present

## 2020-03-05 ENCOUNTER — Other Ambulatory Visit: Payer: Self-pay | Admitting: Neurology

## 2020-03-05 DIAGNOSIS — E559 Vitamin D deficiency, unspecified: Secondary | ICD-10-CM | POA: Diagnosis not present

## 2020-03-05 DIAGNOSIS — E531 Pyridoxine deficiency: Secondary | ICD-10-CM | POA: Diagnosis not present

## 2020-03-05 DIAGNOSIS — M5416 Radiculopathy, lumbar region: Secondary | ICD-10-CM

## 2020-03-05 DIAGNOSIS — E538 Deficiency of other specified B group vitamins: Secondary | ICD-10-CM | POA: Diagnosis not present

## 2020-03-05 DIAGNOSIS — R4189 Other symptoms and signs involving cognitive functions and awareness: Secondary | ICD-10-CM | POA: Diagnosis not present

## 2020-03-05 DIAGNOSIS — M5441 Lumbago with sciatica, right side: Secondary | ICD-10-CM | POA: Diagnosis not present

## 2020-03-05 DIAGNOSIS — E519 Thiamine deficiency, unspecified: Secondary | ICD-10-CM | POA: Diagnosis not present

## 2020-03-05 DIAGNOSIS — R0683 Snoring: Secondary | ICD-10-CM | POA: Diagnosis not present

## 2020-03-05 DIAGNOSIS — R200 Anesthesia of skin: Secondary | ICD-10-CM | POA: Diagnosis not present

## 2020-03-05 DIAGNOSIS — R635 Abnormal weight gain: Secondary | ICD-10-CM | POA: Diagnosis not present

## 2020-03-05 DIAGNOSIS — R202 Paresthesia of skin: Secondary | ICD-10-CM | POA: Diagnosis not present

## 2020-03-08 ENCOUNTER — Ambulatory Visit: Payer: Medicare HMO

## 2020-03-12 ENCOUNTER — Ambulatory Visit: Payer: Medicare HMO | Admitting: Dermatology

## 2020-03-12 ENCOUNTER — Other Ambulatory Visit: Payer: Self-pay

## 2020-03-12 ENCOUNTER — Encounter: Payer: Self-pay | Admitting: Dermatology

## 2020-03-12 DIAGNOSIS — R202 Paresthesia of skin: Secondary | ICD-10-CM | POA: Diagnosis not present

## 2020-03-12 DIAGNOSIS — L818 Other specified disorders of pigmentation: Secondary | ICD-10-CM

## 2020-03-12 DIAGNOSIS — L819 Disorder of pigmentation, unspecified: Secondary | ICD-10-CM

## 2020-03-12 DIAGNOSIS — I781 Nevus, non-neoplastic: Secondary | ICD-10-CM

## 2020-03-12 DIAGNOSIS — L821 Other seborrheic keratosis: Secondary | ICD-10-CM

## 2020-03-12 DIAGNOSIS — L659 Nonscarring hair loss, unspecified: Secondary | ICD-10-CM

## 2020-03-12 NOTE — Patient Instructions (Signed)
Recommend OTC Sarna lotion, Sarna HC lotion, Zostrix cream, or Voltaren cream for itch.

## 2020-03-12 NOTE — Progress Notes (Signed)
   New Patient Visit  Subjective  Erica Schultz is a 69 y.o. female who presents for the following: Alopecia (Patient had ILK injections performed while she live in [place], but states it didn't help. Hair loss became more significant after she had surgery in 2017. She would like to discuss treatment options). She has lighter colored lesions on the legs as well as darker colored lesions on the legs that she would like checked today. Her L shoulder is also itchy a lot even though there is no rash present.   The following portions of the chart were reviewed this encounter and updated as appropriate:  Allergies  Meds  Problems  Med Hx  Surg Hx  Fam Hx     Review of Systems:  No other skin or systemic complaints except as noted in HPI or Assessment and Plan.  Objective  Well appearing patient in no apparent distress; mood and affect are within normal limits.  A focused examination was performed including the scalp, lower legs, trunk. Relevant physical exam findings are noted in the Assessment and Plan.  Objective  Scalp: Diffuse thinning of the crown and widening of the midline part with retention of the frontal hairline - Reviewed progressive nature and prognosis.    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 Patient prefers to schedule that appointment at a later date.  See photos.  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 B/L leg Benign appearing, observe. Reassured patient condition is harmless, and difficult to improve.   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 Discussed topical corticosteriods, Botox injections, and ILK injections.   Spider veins - Dilated blue, purple or red veins at the lower extremities - Reassured - These can be treated by sclerotherapy (a procedure to inject a medicine into the veins to make them disappear) if desired, but the treatment is not covered by insurance  Seborrheic Keratoses - Stuck-on, waxy, tan-brown papules and plaques  - Discussed benign etiology and prognosis. - Observe - Call for any changes  Return for biopsy - patient would like to schedule for a Thursday afternoon.  Luther Redo, CMA, am acting as scribe for Sarina Ser, MD .  Documentation: I have reviewed the above documentation for accuracy and completeness, and I agree with the above.  Sarina Ser, MD

## 2020-03-19 DIAGNOSIS — D259 Leiomyoma of uterus, unspecified: Secondary | ICD-10-CM | POA: Diagnosis not present

## 2020-04-16 DIAGNOSIS — R200 Anesthesia of skin: Secondary | ICD-10-CM | POA: Diagnosis not present

## 2020-04-16 DIAGNOSIS — R202 Paresthesia of skin: Secondary | ICD-10-CM | POA: Diagnosis not present

## 2020-04-16 DIAGNOSIS — H40003 Preglaucoma, unspecified, bilateral: Secondary | ICD-10-CM | POA: Diagnosis not present

## 2020-05-28 DIAGNOSIS — E785 Hyperlipidemia, unspecified: Secondary | ICD-10-CM | POA: Diagnosis not present

## 2020-05-28 DIAGNOSIS — Z862 Personal history of diseases of the blood and blood-forming organs and certain disorders involving the immune mechanism: Secondary | ICD-10-CM | POA: Diagnosis not present

## 2020-05-28 DIAGNOSIS — E1169 Type 2 diabetes mellitus with other specified complication: Secondary | ICD-10-CM | POA: Diagnosis not present

## 2020-05-28 DIAGNOSIS — E7800 Pure hypercholesterolemia, unspecified: Secondary | ICD-10-CM | POA: Diagnosis not present

## 2020-06-03 ENCOUNTER — Ambulatory Visit: Payer: Medicare HMO | Admitting: Dermatology

## 2020-06-04 DIAGNOSIS — E7800 Pure hypercholesterolemia, unspecified: Secondary | ICD-10-CM | POA: Diagnosis not present

## 2020-06-04 DIAGNOSIS — I10 Essential (primary) hypertension: Secondary | ICD-10-CM | POA: Diagnosis not present

## 2020-06-04 DIAGNOSIS — E785 Hyperlipidemia, unspecified: Secondary | ICD-10-CM | POA: Diagnosis not present

## 2020-06-04 DIAGNOSIS — E1169 Type 2 diabetes mellitus with other specified complication: Secondary | ICD-10-CM | POA: Diagnosis not present

## 2020-06-04 DIAGNOSIS — R053 Chronic cough: Secondary | ICD-10-CM | POA: Diagnosis not present

## 2020-06-04 DIAGNOSIS — R059 Cough, unspecified: Secondary | ICD-10-CM | POA: Diagnosis not present

## 2020-06-08 DIAGNOSIS — J40 Bronchitis, not specified as acute or chronic: Secondary | ICD-10-CM | POA: Diagnosis not present

## 2020-06-08 DIAGNOSIS — J019 Acute sinusitis, unspecified: Secondary | ICD-10-CM | POA: Diagnosis not present

## 2020-06-18 DIAGNOSIS — R053 Chronic cough: Secondary | ICD-10-CM | POA: Diagnosis not present

## 2020-07-02 ENCOUNTER — Other Ambulatory Visit: Payer: Self-pay | Admitting: Family Medicine

## 2020-07-02 DIAGNOSIS — E785 Hyperlipidemia, unspecified: Secondary | ICD-10-CM | POA: Diagnosis not present

## 2020-07-02 DIAGNOSIS — Z1231 Encounter for screening mammogram for malignant neoplasm of breast: Secondary | ICD-10-CM

## 2020-07-02 DIAGNOSIS — Z1389 Encounter for screening for other disorder: Secondary | ICD-10-CM | POA: Diagnosis not present

## 2020-07-02 DIAGNOSIS — E119 Type 2 diabetes mellitus without complications: Secondary | ICD-10-CM | POA: Diagnosis not present

## 2020-07-02 DIAGNOSIS — R053 Chronic cough: Secondary | ICD-10-CM | POA: Diagnosis not present

## 2020-07-02 DIAGNOSIS — Z0000 Encounter for general adult medical examination without abnormal findings: Secondary | ICD-10-CM | POA: Diagnosis not present

## 2020-07-02 DIAGNOSIS — I10 Essential (primary) hypertension: Secondary | ICD-10-CM | POA: Diagnosis not present

## 2020-07-08 DIAGNOSIS — R200 Anesthesia of skin: Secondary | ICD-10-CM | POA: Diagnosis not present

## 2020-07-08 DIAGNOSIS — R202 Paresthesia of skin: Secondary | ICD-10-CM | POA: Diagnosis not present

## 2020-07-08 DIAGNOSIS — R0683 Snoring: Secondary | ICD-10-CM | POA: Diagnosis not present

## 2020-07-14 ENCOUNTER — Other Ambulatory Visit: Payer: Self-pay | Admitting: Neurology

## 2020-07-14 DIAGNOSIS — R200 Anesthesia of skin: Secondary | ICD-10-CM

## 2020-07-14 DIAGNOSIS — R202 Paresthesia of skin: Secondary | ICD-10-CM

## 2020-07-21 ENCOUNTER — Other Ambulatory Visit: Payer: Self-pay

## 2020-07-21 ENCOUNTER — Ambulatory Visit: Payer: Medicare HMO | Attending: Neurology | Admitting: Physical Therapy

## 2020-07-21 ENCOUNTER — Encounter: Payer: Self-pay | Admitting: Physical Therapy

## 2020-07-21 DIAGNOSIS — M545 Low back pain, unspecified: Secondary | ICD-10-CM | POA: Insufficient documentation

## 2020-07-21 DIAGNOSIS — M6281 Muscle weakness (generalized): Secondary | ICD-10-CM | POA: Insufficient documentation

## 2020-07-21 DIAGNOSIS — R293 Abnormal posture: Secondary | ICD-10-CM | POA: Insufficient documentation

## 2020-07-21 NOTE — Therapy (Signed)
[place] [facility] [address] [place], [place], [address] Phone: [phone]   Fax:  [phone]  Physical Therapy Evaluation  Patient Details  Name: Erica Schultz MRN: [id number] Date of Birth: 10-Feb-1951 Referring Provider (PT): Jennings Books   Encounter Date: 07/21/2020   PT End of Session - 07/21/20 1348    Visit Number 1    Number of Visits 13    Date for PT Re-Evaluation 09/01/20    Authorization Type IE 07/21/20    PT Start Time 1400    PT Stop Time 1450    PT Time Calculation (min) 50 min    Activity Tolerance Patient tolerated treatment well    Behavior During Therapy [facility] for tasks assessed/performed           Past Medical History:  Diagnosis Date  . Hypertension     Past Surgical History:  Procedure Laterality Date  . BREAST CYST ASPIRATION Right 2001  . UMBILICAL HERNIA REPAIR  2017    There were no vitals filed for this visit.        [facility] PT Assessment - 07/21/20 0001      Assessment   Medical Diagnosis Lumbar radiculopathy    Referring Provider (PT) Jennings Books    Hand Dominance Right    Next MD Visit August 2022    Prior Therapy Yes      Balance Screen   Has the patient fallen in the past 6 months No           SUBJECTIVE Chief complaint:  Patient states that she has been suffering with back pain since her son's pregnancy about 49 years ago. Patient notes that this back pain was not as limiting when she was younger, but now she is limited a bit more. She notes she has a pinching sensation in the L lower back; she has radiating pain in L leg. Patient had NCS with normal findings at the end of last year. Patient works 5 shifts a week of duration ranging from 8-13 hours. 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 Patient has difficulty with getting out of car. Patient notes family history and personal history of sleep disturbance.  Pain location:  lumbar Pain: Present 8/10, Best 8/10, Worst 10/10: accumulation of activity Pain quality: pain quality: pinching,  Radiating pain: Yes  Numbness/Tingling: Yes L middle toes 24 hour pain behavior: stiff and painful in the AM, takes a couple minutes to straighten up; fatigued and painful in the PM Aggravating factors: laying in bed, prolonged positioning, stair climbing Easing factors: changing position,  How long can you sit: 60 min How long can you stand: unable to stand without aggravation of pain How long can you walk: unsure History of back injury, pain, surgery, or therapy: Yes  Occupational demands: bakery,  Hobbies: travel; church; cruises Goals: decrease pain Red flags (bowel/bladder changes, saddle paresthesia, personal history of cancer, chills/fever, night sweats, unrelenting pain, first onset of insidious LBP <20 y/o) Negative    OBJECTIVE  Mental Status Patient is oriented to person, place and time.  Recent memory is intact.  Remote memory is intact.  Attention span and concentration are intact.  Expressive speech is intact.  Patient's fund of knowledge is within normal limits for educational level.  SENSATION: Grossly intact to light touch bilateral LEs as determined by testing dermatomes L2-S2 Proprioception and hot/cold testing deferred on this date   MUSCULOSKELETAL: Tremor:  None Bulk: Normal Tone: Normal No visible step-off along spinal column  Posture Lumbar lordosis: WNL Iliac crest height: R elevated; corrected with heel lift Lumbar lateral shift: negative Lower crossed syndrome (tight hip flexors and erector spinae; weak gluts and abs): negative  Gait Limited foot clearance, B. Decreased arm swing; mild R lateral trunk lean (diminished in presence of heel lift on L)   Palpation TTP over L QL and L SIJ.    Strength (out of 5) R/L 5/5 Hip flexion 5/5 Hip ER 5/4* Hip IR 5/5 Hip abduction 5/5 Hip adduction 5/5 Hip extension 5/5 Knee  extension 5/5 Knee flexion 5/5 Ankle dorsiflexion 5/5 Ankle plantarflexion *Indicates pain   AROM (degrees) R/L (all movements include overpressure unless otherwise stated) Lumbar forward flexion (65): 40 degrees* Lumbar extension (30): WFL* Lumbar lateral flexion (25): R: WFL*  L: WFL* Thoracic and Lumbar rotation (30 degrees):  R: 30 degrees L: 15 degrees Hip IR (0-45): B: < 30 degrees, non-painful Hip ER (0-45): B: ~30 degrees, non-painful Hip Flexion (0-125): WFL *Indicates pain  Repeated Movements No centralization or peripheralization of symptoms with repeated lumbar extension or flexion.    Muscle Length Hamstrings: R: 40 degrees L: 40 degrees    SPECIAL TESTS Lumbar Radiculopathy and Discogenic: Centralization and Peripheralization (SN 92, -LR 0.12): Negative Slump (SN 83, -LR 0.32): R: Negative L: Negative SLR (SN 92, -LR 0.29): R: Negative L:  Negative Crossed SLR (SP 90): R: Negative L: Negative  Hip: FABER (SN 81): R: Negative L: Negative FADIR (SN 94): R: Negative L: Negative  Functional Tasks Sit to stand: WFL   ASSESSMENT Patient is a 70 year old presenting to clinic with chief complaints of lumbar back pain with radiating symptoms to LLE. Upon examination, patient demonstrates deficits in pain free spinal ROM, posture, gait, hamstring extensibility, and L hip IR strength as evidenced by 4/5 and painful L hip IR, ~40 degrees of hamstring length with SLR, limited foot clearance B during gait, R lateral trunk lean, elevation of R IC in static standing, and 8/10 pain with all spinal motions. Patient's responses on FOTO outcome measures (44) indicate significant functional limitations/disability/distress. Patient's progress may be limited due to persistence of pain; however, patient's receptivity and reasonable expectations are advantageous. Patient was able to achieve basic understanding of central sensitization during today's evaluation and responded positively to  educational interventions. Patient will benefit from continued skilled therapeutic intervention to address deficits in pain free spinal ROM, posture, gait, hamstring extensibility, and L hip IR strength in order to increase function and improve overall QOL.    Objective measurements completed on examination: See above findings.   EDUCATION Patient educated on prognosis, POC, and provided with HEP including: heel lift for 30 min/day. Patient articulated understanding and returned demonstration. Patient will benefit from further education in order to maximize compliance and understanding for long-term therapeutic gains.         PT Long Term Goals - 07/21/20 1645      PT LONG TERM GOAL #1   Title Patient will be independent with HEP in order to improve strength and decrease back pain in order to improve pain-free function at home and work.    Baseline IE: not initiated    Time 6    Period Weeks    Status New    Target Date 09/01/20      PT LONG TERM GOAL #2   Title Patient will decrease worst back pain (as reported over a given 7 day  period) on NPRS by at least 2 points in order to demonstrate clinically significant reduction in back pain.    Baseline IE: 10/10    Time 6    Period Weeks    Status New    Target Date 09/01/20      PT LONG TERM GOAL #3   Title Patient will report "a little bit of difficulty" or "no difficulty" with going up/down 2 flights of stairs in order to safely participate in activities at home and in the community.    Baseline IE: "quite a bit of difficulty"    Time 6    Period Weeks    Status New    Target Date 09/01/20      PT LONG TERM GOAL #4   Title Patient will demonstrate improved function as evidenced by a score of 50 on FOTO measure for full participation in activities at home and in the community.    Baseline IE: 44    Time 6    Period Weeks    Status New    Target Date 09/01/20      PT LONG TERM GOAL #5   Title Patient will demonstrate  understanding of basic self-management/down-regulation of the nervous system for persistent pain condition and stress as evidenced by diaphragmatic breathing without cueing, body scan/progressive relaxation meditation, and improved sleep hygiene in order to transition to independent management of patient's chief complaint: persistent lumbar pain.    Baseline IE: not demonstrated    Time 6    Period Weeks    Status New    Target Date 09/01/20                  Plan - 07/21/20 1349    Clinical Impression Statement Patient is a 70 year old presenting to clinic with chief complaints of lumbar back pain with radiating symptoms to LLE. Upon examination, patient demonstrates deficits in pain free spinal ROM, posture, gait, hamstring extensibility, and L hip IR strength as evidenced by 4/5 and painful L hip IR, ~40 degrees of hamstring length with SLR, limited foot clearance B during gait, R lateral trunk lean, elevation of R IC in static standing, and 8/10 pain with all spinal motions. Patient's responses on FOTO outcome measures (44) indicate significant functional limitations/disability/distress. Patient's progress may be limited due to persistence of pain; however, patient's receptivity and reasonable expectations are advantageous. Patient was able to achieve basic understanding of central sensitization during today's evaluation and responded positively to educational interventions. Patient will benefit from continued skilled therapeutic intervention to address deficits in pain free spinal ROM, posture, gait, hamstring extensibility, and L hip IR strength in order to increase function and improve overall QOL.    Personal Factors and Comorbidities Age;Past/Current Experience;Time since onset of injury/illness/exacerbation;Comorbidity 3+;Profession    Comorbidities HTN, OSA, DM, hyperlipidemia, migraine    Examination-Activity Limitations Squat;Lift;Bend;Stand;Locomotion Level;Carry;Sleep;Sit;Reach  Overhead;Stairs    Transport planner;Occupation;Driving    Stability/Clinical Decision Making Evolving/Moderate complexity    Clinical Decision Making Moderate    Rehab Potential Fair    PT Frequency 2x / week    PT Duration 6 weeks    PT Treatment/Interventions Moist Heat;Cryotherapy;Therapeutic activities;Neuromuscular re-education;Therapeutic exercise;Patient/family education;Balance training;Functional mobility training;Stair training;Gait training;Manual techniques;Taping;Vestibular;Orthotic Fit/Training;ADLs/Self Care Home Management    PT Next Visit Plan PCST    PT Home Exercise Plan not initiated at this time    Consulted and Agree with Plan of Care Patient  Patient will benefit from skilled therapeutic intervention in order to improve the following deficits and impairments:  Abnormal gait,Decreased activity tolerance,Decreased coordination,Decreased strength,Postural dysfunction,Improper body mechanics,Pain,Difficulty walking,Decreased balance,Decreased endurance  Visit Diagnosis: Low back pain, unspecified back pain laterality, unspecified chronicity, unspecified whether sciatica present - Plan: PT plan of care cert/re-cert  Abnormal posture - Plan: PT plan of care cert/re-cert  Muscle weakness (generalized) - Plan: PT plan of care cert/re-cert     Problem List There are no problems to display for this patient.  Myles Gip PT, DPT [phone]  07/21/2020, 4:51 PM  [place] [facility] [address] [place], [place], [address] Phone: [phone]   Fax:  [phone]  Name: Erica Schultz MRN: [id number] Date of Birth: 01-03-1951

## 2020-07-23 ENCOUNTER — Ambulatory Visit: Payer: Medicare HMO

## 2020-07-28 ENCOUNTER — Ambulatory Visit: Payer: Medicare HMO | Attending: Neurology | Admitting: Physical Therapy

## 2020-07-28 ENCOUNTER — Encounter: Payer: Self-pay | Admitting: Physical Therapy

## 2020-07-28 ENCOUNTER — Other Ambulatory Visit: Payer: Self-pay

## 2020-07-28 DIAGNOSIS — M545 Low back pain, unspecified: Secondary | ICD-10-CM | POA: Diagnosis not present

## 2020-07-28 DIAGNOSIS — R293 Abnormal posture: Secondary | ICD-10-CM | POA: Insufficient documentation

## 2020-07-28 DIAGNOSIS — M6281 Muscle weakness (generalized): Secondary | ICD-10-CM | POA: Diagnosis not present

## 2020-07-28 NOTE — Therapy (Signed)
[facility] [address] [place], [place], [address] Phone: [phone]   Fax:  [phone]  Physical Therapy Treatment  Patient Details  Name: Erica Schultz MRN: [id number] Date of Birth: 1950/12/11 Referring Provider (PT): Jennings Books   Encounter Date: 07/28/2020   PT End of Session - 07/28/20 1310    Visit Number 2    Number of Visits 13    Date for PT Re-Evaluation 09/01/20    Authorization Type IE 07/21/20    PT Start Time 1310    PT Stop Time 1405    PT Time Calculation (min) 55 min    Activity Tolerance Patient tolerated treatment well    Behavior During Therapy [facility] for tasks assessed/performed           Past Medical History:  Diagnosis Date  . Hypertension     Past Surgical History:  Procedure Laterality Date  . BREAST CYST ASPIRATION Right 2001  . UMBILICAL HERNIA REPAIR  2017    There were no vitals filed for this visit.   Subjective Assessment - 07/28/20 1315    Subjective Patient notes that she is in a lot of pain and today is not a good day. She reports waking with a headache and such intense pain that she almost called out of work. Patient did attend work but had pain increasing throughout the day.    Currently in Pain? Yes    Pain Score 9     Pain Location Back    Pain Orientation Lower          TREATMENT  Manual Therapy: STM and TPR performed to B lumbar mm to allow for decreased tension and pain and improved posture and function  Neuromuscular Re-education: Supine hooklying diaphragmatic breathing with VCs and TCs for downregulation of the nervous system and improved management of IAP Supine pelvic tilts with coordinated breath for improved lumbar mobility and decreased pain Supine hooklying trunk rotations with coordinated breath for improved lumbar mobility and decreased pain Supine single knee to chest with diaphragmatic breath for improved lumbar mobility and decreased pain,  BLE Supine double knee to chest with diaphragmatic breath for improved lumbar mobility and decreased pain Child's pose with diaphragmatic breath for improved lumbar mobility and decreased pain Quadruped cat/cow with diaphragmatic breath for improved lumbar mobility and decreased pain Patient education on vagus nerve stimulation via diaphragmatic breath for improved pain coping skills.  Treatments unbilled: MHP to lumbar region during supine activities   Patient educated throughout session on appropriate technique and form using multi-modal cueing, HEP, and activity modification. Patient articulated understanding and returned demonstration.  Patient Response to interventions: 7/10 pain  ASSESSMENT Patient presents to clinic with excellent motivation to participate in therapy. Patient demonstrates deficits in pain free spinal ROM, posture, gait, hamstring extensibility, and L hip IR strength. Patient able to achieve clinically significant reduction in pain with pain coping skills, gentle movement, and manual during today's session and responded positively to all interventions. Patient will benefit from continued skilled therapeutic intervention to address remaining deficits in pain free spinal ROM, posture, gait, hamstring extensibility, and L hip IR strength in order to increase function and improve overall QOL.     PT Long Term Goals - 07/21/20 1645      PT LONG TERM GOAL #1   Title Patient will be independent with HEP in order to improve strength and decrease back pain in order to improve pain-free function at home and work.  Baseline IE: not initiated    Time 6    Period Weeks    Status New    Target Date 09/01/20      PT LONG TERM GOAL #2   Title Patient will decrease worst back pain (as reported over a given 7 day period) on NPRS by at least 2 points in order to demonstrate clinically significant reduction in back pain.    Baseline IE: 10/10    Time 6    Period Weeks     Status New    Target Date 09/01/20      PT LONG TERM GOAL #3   Title Patient will report "a little bit of difficulty" or "no difficulty" with going up/down 2 flights of stairs in order to safely participate in activities at home and in the community.    Baseline IE: "quite a bit of difficulty"    Time 6    Period Weeks    Status New    Target Date 09/01/20      PT LONG TERM GOAL #4   Title Patient will demonstrate improved function as evidenced by a score of 50 on FOTO measure for full participation in activities at home and in the community.    Baseline IE: 44    Time 6    Period Weeks    Status New    Target Date 09/01/20      PT LONG TERM GOAL #5   Title Patient will demonstrate understanding of basic self-management/down-regulation of the nervous system for persistent pain condition and stress as evidenced by diaphragmatic breathing without cueing, body scan/progressive relaxation meditation, and improved sleep hygiene in order to transition to independent management of patient's chief complaint: persistent lumbar pain.    Baseline IE: not demonstrated    Time 6    Period Weeks    Status New    Target Date 09/01/20                 Plan - 07/28/20 1311    Clinical Impression Statement Patient presents to clinic with excellent motivation to participate in therapy. Patient demonstrates deficits in pain free spinal ROM, posture, gait, hamstring extensibility, and L hip IR strength. Patient able to achieve clinically significant reduction in pain with pain coping skills, gentle movement, and manual during today's session and responded positively to all interventions. Patient will benefit from continued skilled therapeutic intervention to address remaining deficits in pain free spinal ROM, posture, gait, hamstring extensibility, and L hip IR strength in order to increase function and improve overall QOL.    Personal Factors and Comorbidities Age;Past/Current Experience;Time since  onset of injury/illness/exacerbation;Comorbidity 3+;Profession    Comorbidities HTN, OSA, DM, hyperlipidemia, migraine    Examination-Activity Limitations Squat;Lift;Bend;Stand;Locomotion Level;Carry;Sleep;Sit;Reach Overhead;Stairs    Transport planner;Occupation;Driving    Stability/Clinical Decision Making Evolving/Moderate complexity    Rehab Potential Fair    PT Frequency 2x / week    PT Duration 6 weeks    PT Treatment/Interventions Moist Heat;Cryotherapy;Therapeutic activities;Neuromuscular re-education;Therapeutic exercise;Patient/family education;Balance training;Functional mobility training;Stair training;Gait training;Manual techniques;Taping;Vestibular;Orthotic Fit/Training;ADLs/Self Care Home Management    PT Next Visit Plan quadruped lumbar mobility    PT Home Exercise Plan LBP gentle series    Consulted and Agree with Plan of Care Patient           Patient will benefit from skilled therapeutic intervention in order to improve the following deficits and impairments:  Abnormal gait,Decreased activity tolerance,Decreased coordination,Decreased strength,Postural dysfunction,Improper body mechanics,Pain,Difficulty walking,Decreased balance,Decreased endurance  Visit Diagnosis: Low back pain, unspecified back pain laterality, unspecified chronicity, unspecified whether sciatica present  Abnormal posture  Muscle weakness (generalized)     Problem List There are no problems to display for this patient.  Myles Gip PT, DPT [phone]  07/28/2020, 2:25 PM  [facility] [address] [place], [place], [address] Phone: [phone]   Fax:  [phone]  Name: Erica Schultz MRN: [id number] Date of Birth: 25-Dec-1950

## 2020-07-29 ENCOUNTER — Ambulatory Visit
Admission: RE | Admit: 2020-07-29 | Discharge: 2020-07-29 | Disposition: A | Discharge status: Home / Self Care | Payer: Medicare HMO | Source: Ambulatory Visit | Attending: Neurology | Admitting: Neurology

## 2020-07-29 DIAGNOSIS — R202 Paresthesia of skin: Secondary | ICD-10-CM | POA: Insufficient documentation

## 2020-07-29 DIAGNOSIS — M545 Low back pain, unspecified: Secondary | ICD-10-CM | POA: Diagnosis not present

## 2020-07-29 DIAGNOSIS — R200 Anesthesia of skin: Secondary | ICD-10-CM | POA: Diagnosis not present

## 2020-07-29 IMAGING — MR MR LUMBAR SPINE W/O CM
5 series · 31 of 48 positions shown · non-contrast
Comparison: [DATE] report.

CLINICAL DATA: Chronic lower back pain with left foot numbness.

EXAM:
MRI LUMBAR SPINE WITHOUT CONTRAST
TECHNIQUE: Multiplanar, multisequence MR imaging of the lumbar spine was
performed. No intravenous contrast was administered.

[Series 5: T2 · sagittal · 4.0mm · 0.81mm/px · 6 of 17 slices shown (1 of 2)]
[im 1/17]
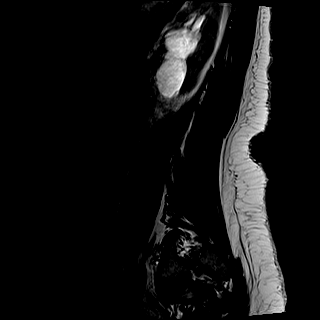
[im 4/17]
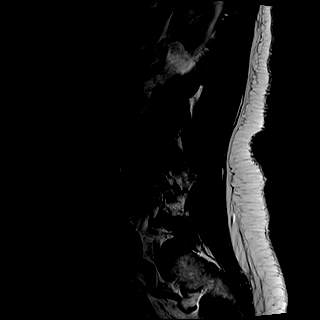
[im 7/17]
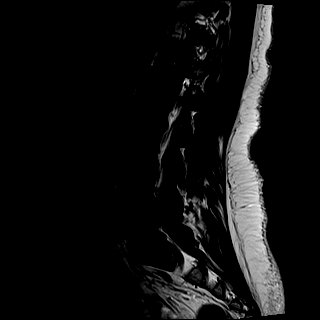
[im 10/17]
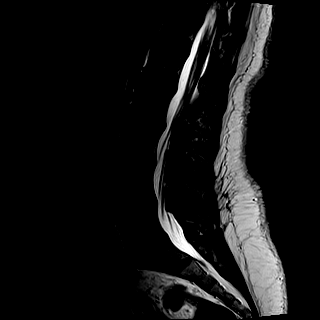
[im 13/17]
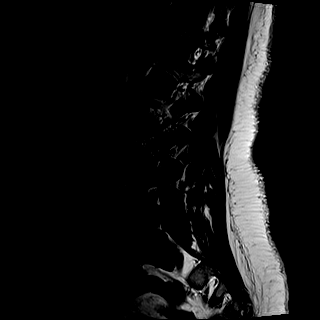
[im 17/17]
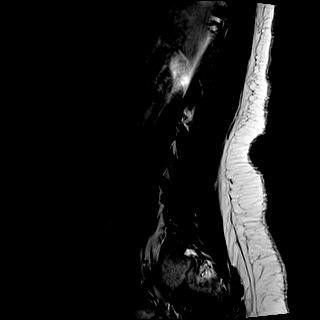

[Series 6: T1 · sagittal · 4.0mm · 0.81mm/px · 7 of 17 slices shown (1 of 2)]
[im 1/17]
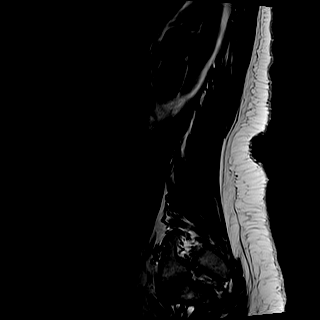
[im 3/17]
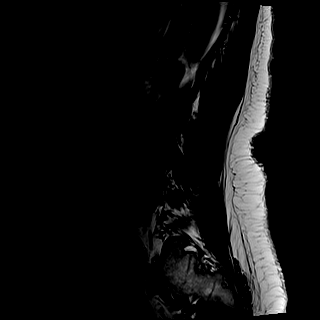
[im 6/17]
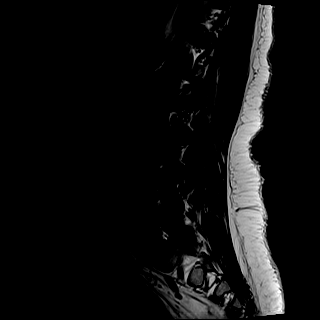
[im 9/17]
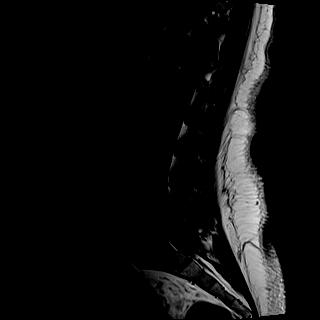
[im 11/17]
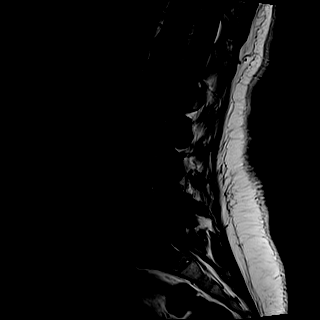
[im 14/17]
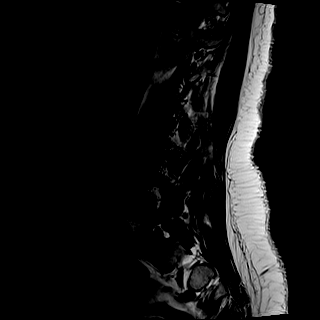
[im 17/17]
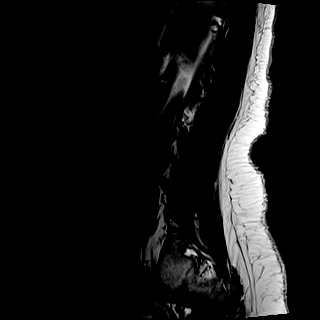

[Series 7: STIR · sagittal · 4.0mm · 0.41mm/px · 2 of 17 slices shown]
[im 1/17]
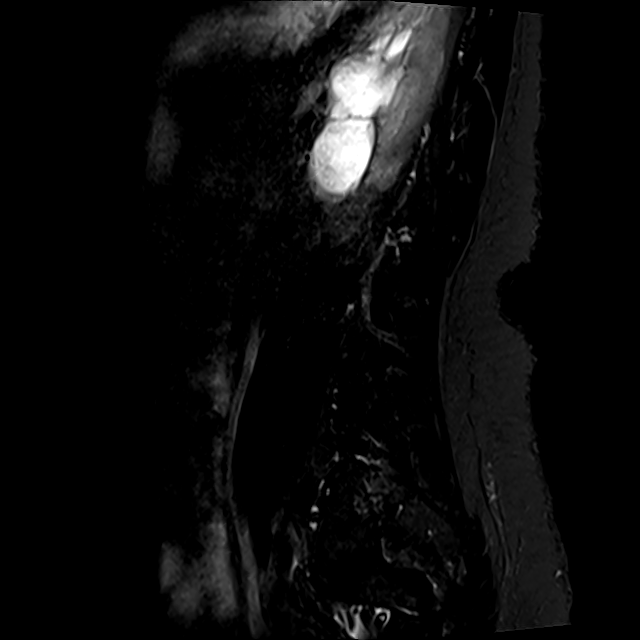
[im 3/17]
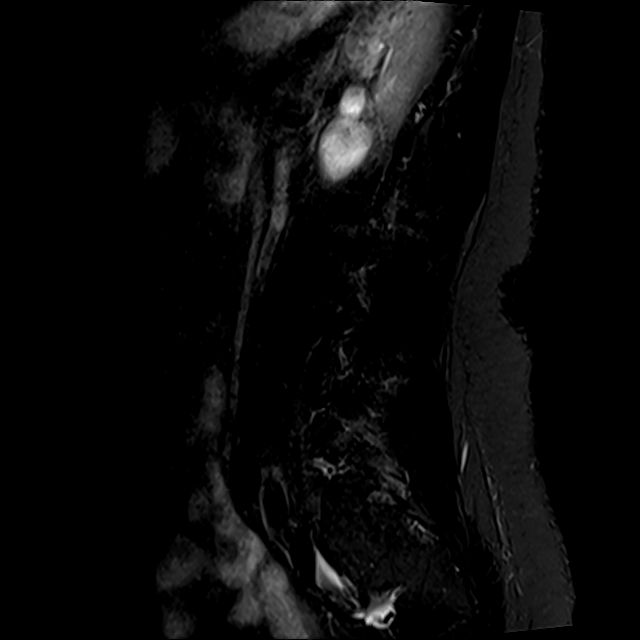

[Series 8: T2 · axial · 4.0mm · 0.78mm/px · z∈[-74,+131]mm · 8 of 37 slices shown (2 of 2)]
[im 1/37]
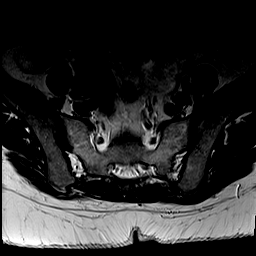
[im 6/37]
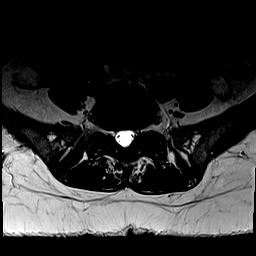
[im 12/37]
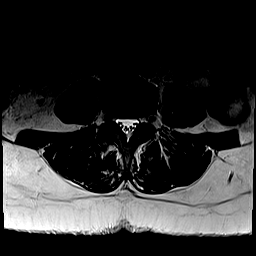
[im 17/37]
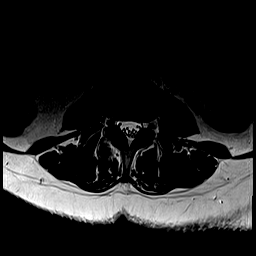
[im 20/37]
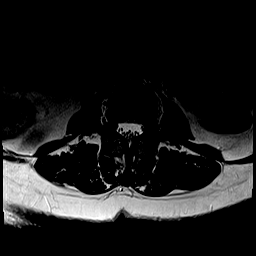
[im 25/37]
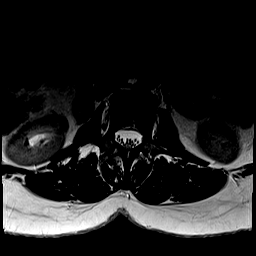
[im 31/37]
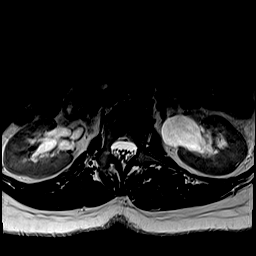
[im 37/37]
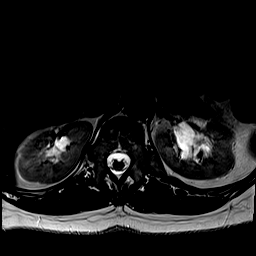

[Series 9: T1 · axial · 4.0mm · 0.39mm/px · z∈[-74,+131]mm · 8 of 37 slices shown (2 of 2)]
[im 1/37]
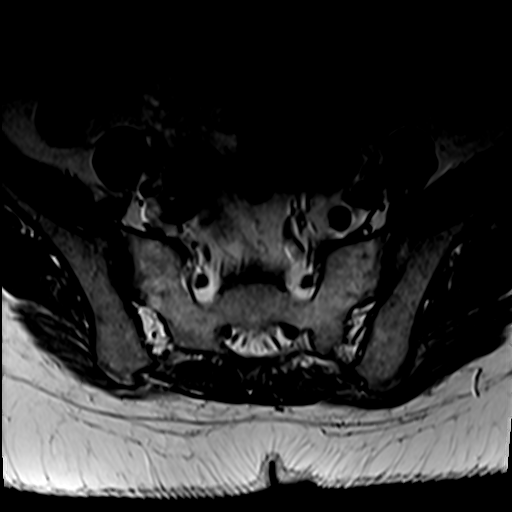
[im 6/37]
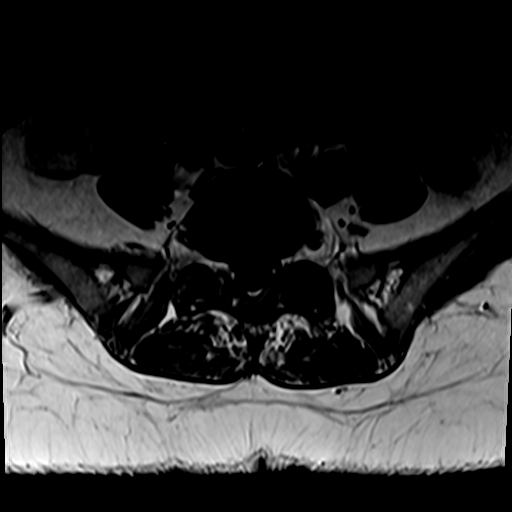
[im 12/37]
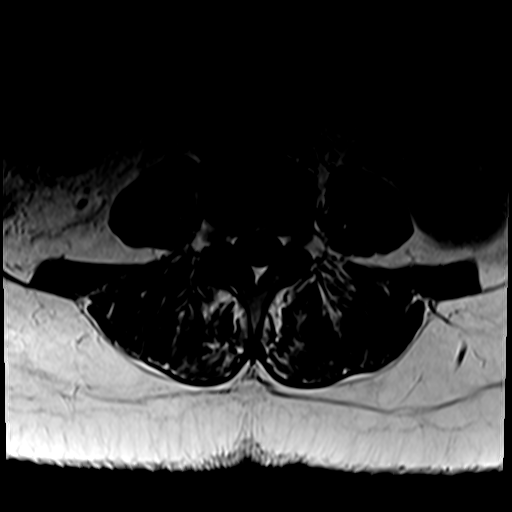
[im 17/37]
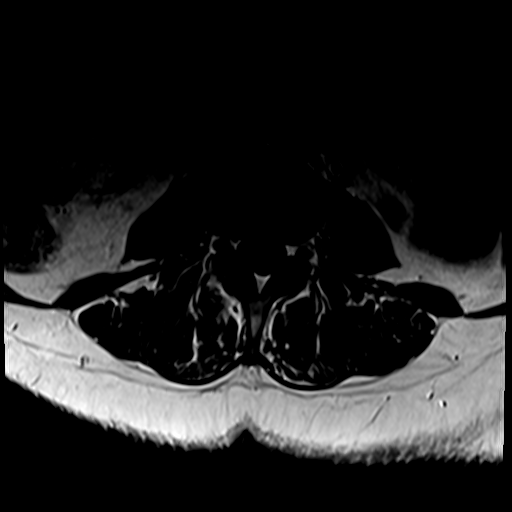
[im 20/37]
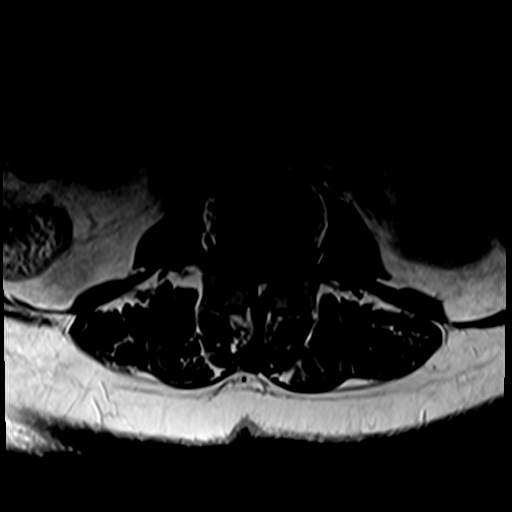
[im 25/37]
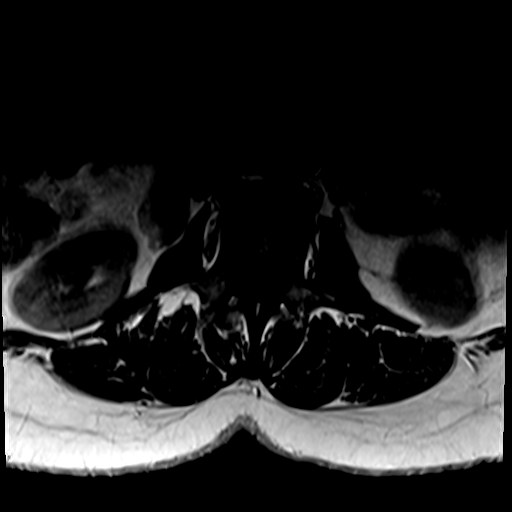
[im 31/37]
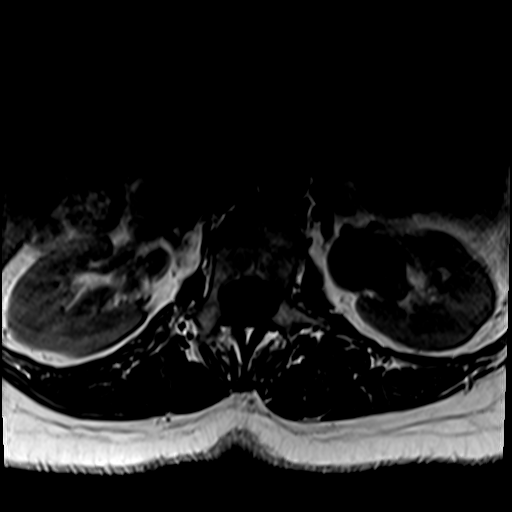
[im 37/37]
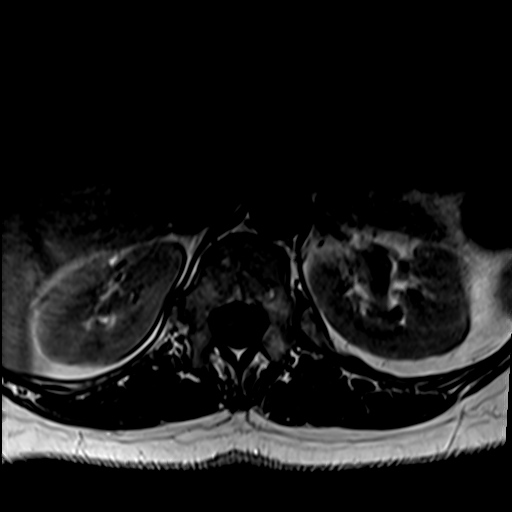

[31 of 48 positions shown; findings below may reference images not displayed]

FINDINGS: Segmentation:  Standard.

Alignment:  Normal.

Vertebrae: Normal bone marrow signal intensity. No fracture or
aggressive osseous lesion.

Conus medullaris and cauda equina: Conus extends to the L1 level.
Conus and cauda equina appear normal.

Disc levels: Multilevel desiccation.

L1-2: Minimal disc bulge. Bilateral facet degenerative spurring.
Patent spinal canal and neural foramen.

L2-3: Mild disc bulge with left extraforaminal protrusion/annular
fissuring. Bilateral facet degenerative spurring. Patent spinal
canal. Mild bilateral neural foraminal narrowing.

L3-4: Mild disc bulge with superimposed left foraminal protrusion.
Bilateral facet degenerative spurring. Patent spinal canal. Mild
bilateral neural foraminal narrowing.

L4-5: Mild disc bulge with abutment of the exiting right L4 nerve
root. Facet degenerative spurring. Mild spinal canal and bilateral
neural foraminal narrowing.

L5-S1: Minimal disc bulge and bilateral facet hypertrophy. Patent
spinal canal. Mild bilateral neural foraminal narrowing.

Paraspinal and other soft tissues: Mild degenerative related
periarticular edema involving the left greater than right L4-5 facet
joints.
IMPRESSION: Mild bilateral L2-S1 neural foraminal narrowing.

Mild L4-5 spinal canal narrowing.

Mild degenerative related inflammation involving the left greater
than right L4-5 facet joints.

## 2020-07-30 ENCOUNTER — Encounter: Payer: Medicare HMO | Admitting: Physical Therapy

## 2020-08-06 ENCOUNTER — Encounter: Payer: Medicare HMO | Admitting: Physical Therapy

## 2020-08-06 ENCOUNTER — Other Ambulatory Visit: Payer: Self-pay

## 2020-08-06 ENCOUNTER — Ambulatory Visit
Admission: RE | Admit: 2020-08-06 | Discharge: 2020-08-06 | Disposition: A | Discharge status: Home / Self Care | Payer: Medicare HMO | Source: Ambulatory Visit | Attending: Family Medicine | Admitting: Family Medicine

## 2020-08-06 DIAGNOSIS — Z1231 Encounter for screening mammogram for malignant neoplasm of breast: Secondary | ICD-10-CM | POA: Insufficient documentation

## 2020-08-06 IMAGING — MG MM DIGITAL SCREENING BILAT W/ TOMO AND CAD
8 series · 8 of 24 positions shown · non-contrast
Comparison: Previous exam(s).

CLINICAL DATA: Screening.

EXAM:
DIGITAL SCREENING BILATERAL MAMMOGRAM WITH TOMOSYNTHESIS AND CAD
TECHNIQUE: Bilateral screening digital craniocaudal and mediolateral oblique
mammograms were obtained. Bilateral screening digital breast
tomosynthesis was performed. The images were evaluated with
computer-aided detection.

[R CC synth-2D]
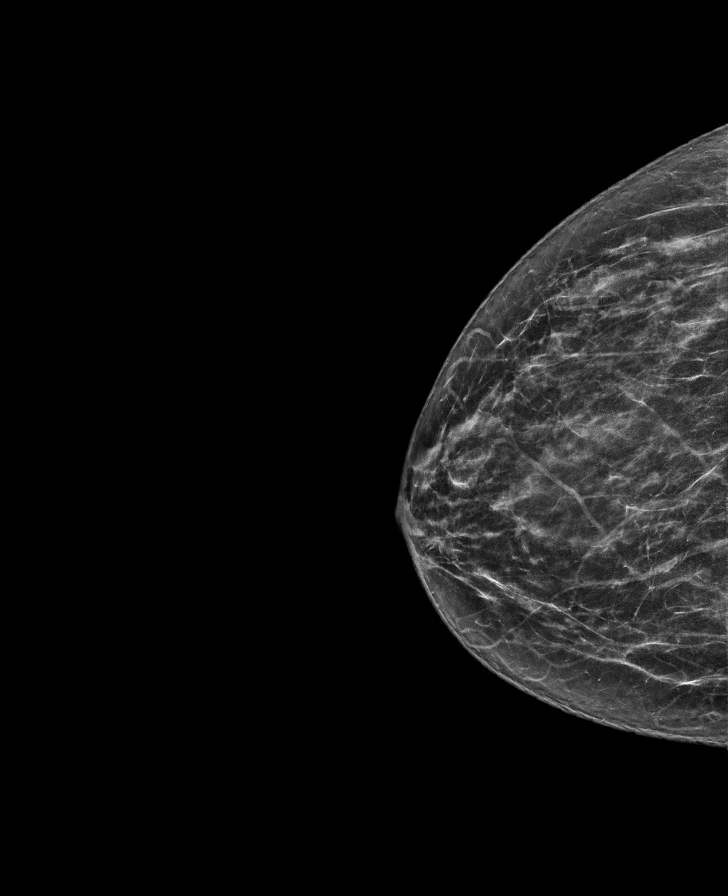

[L CC synth-2D]
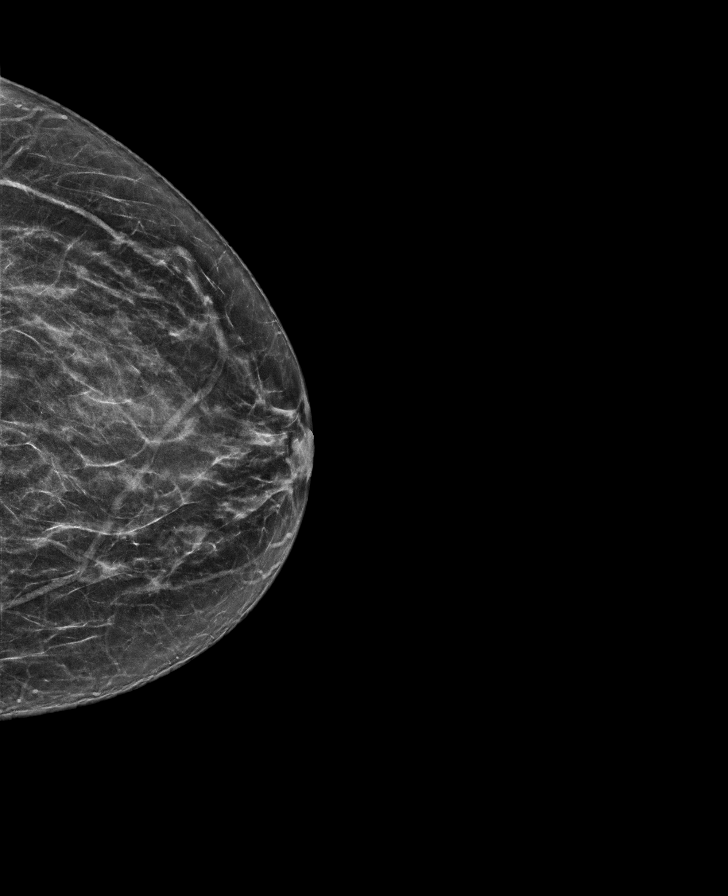

[L MLO synth-2D]
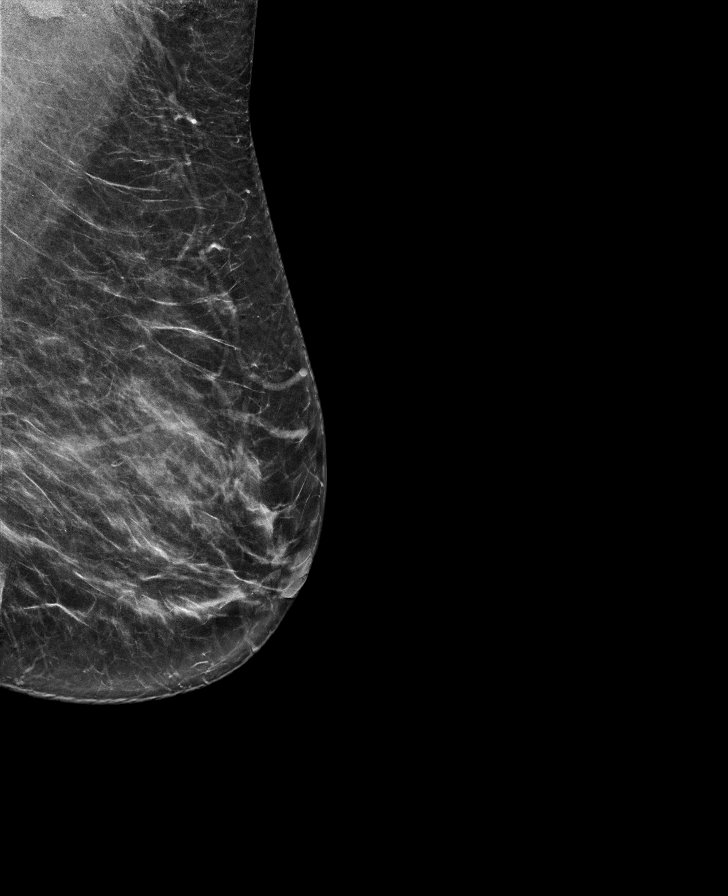

[R MLO synth-2D]
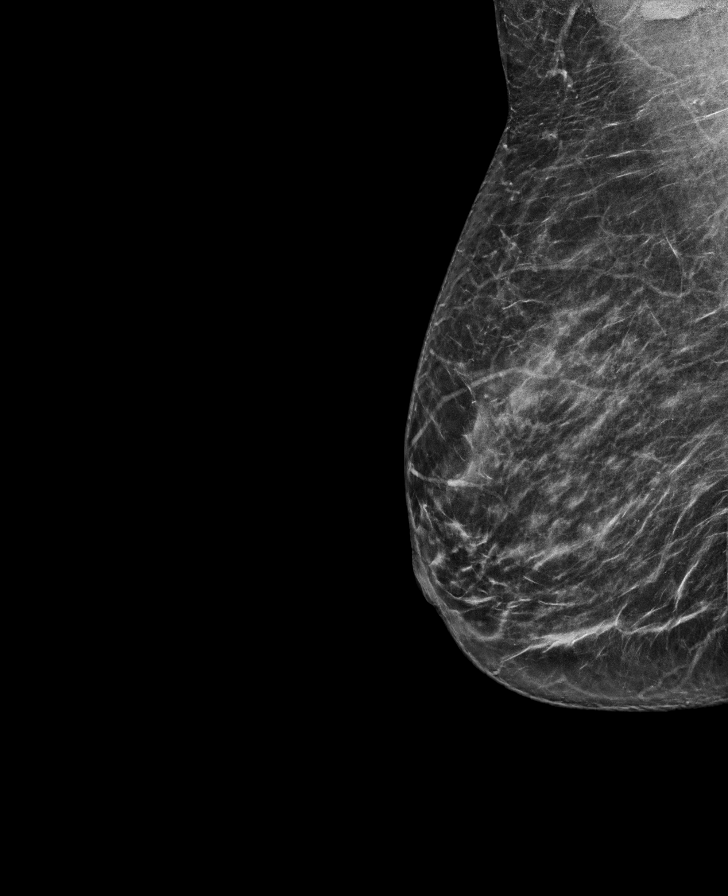

[L CC tomo · tomo slice 31/61.0]
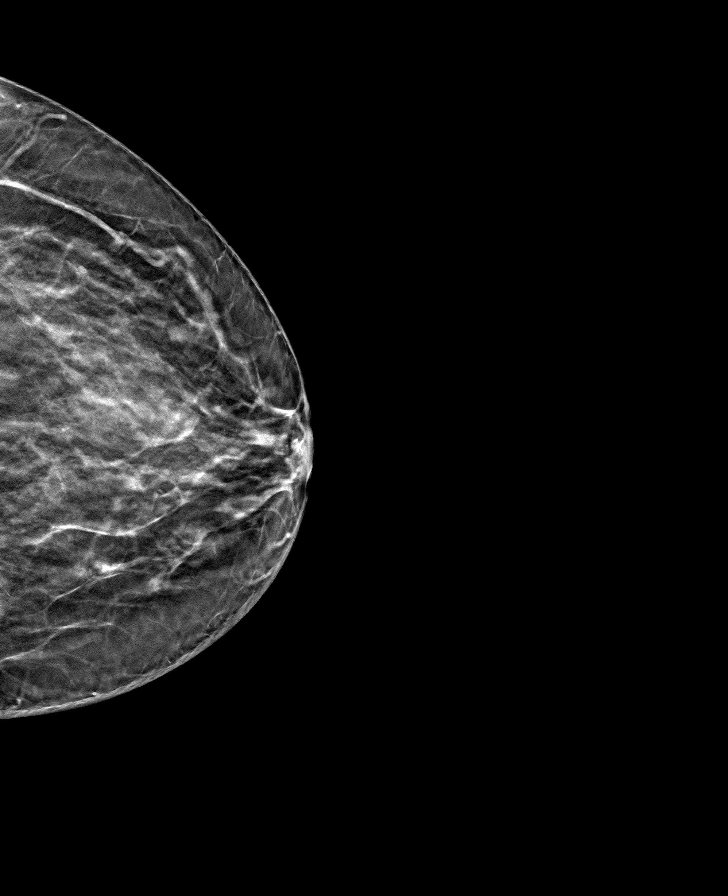

[R MLO tomo · tomo slice 34/67.0]
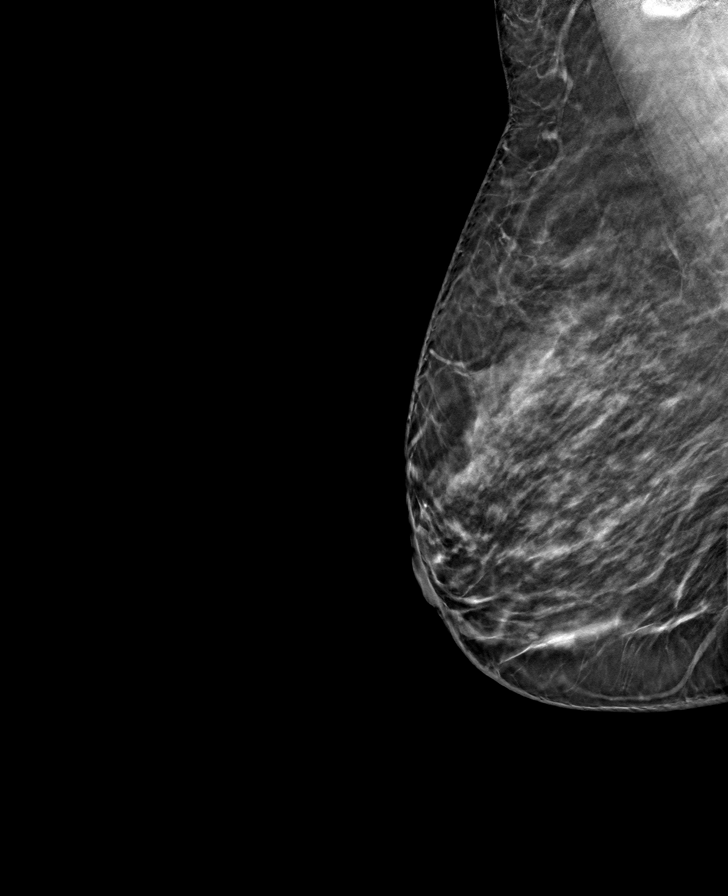

[L MLO tomo · tomo slice 36/71.0]
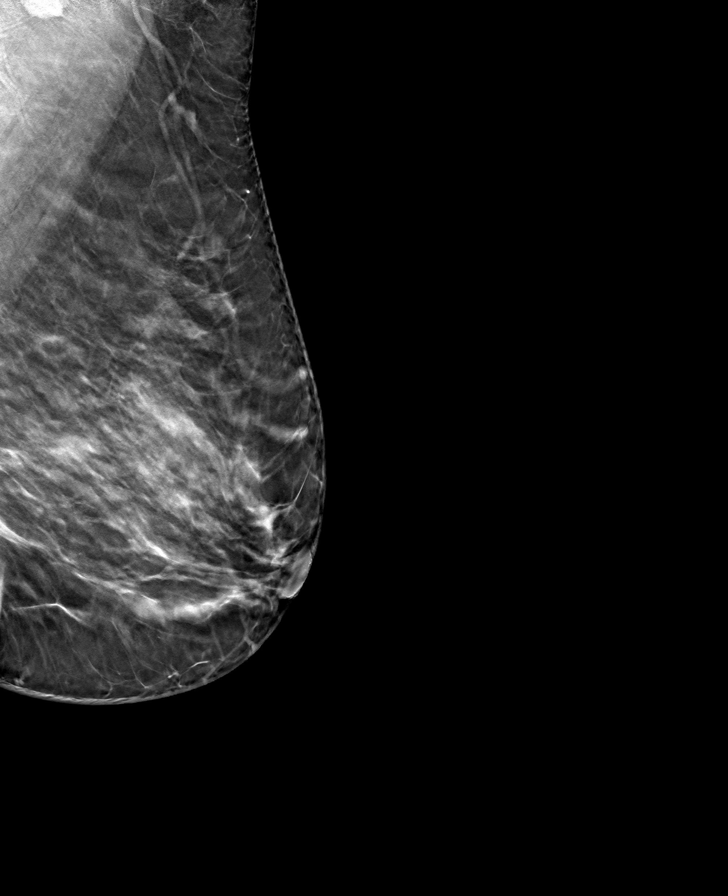

[R CC tomo · tomo slice 31/62.0]
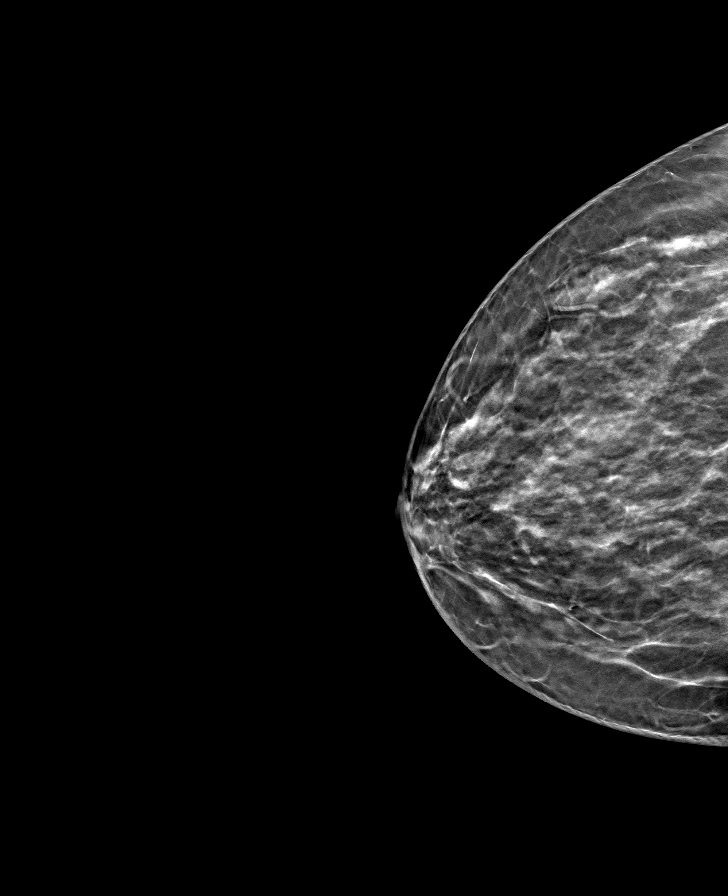

[8 of 24 positions shown; findings below may reference images not displayed]

ACR Breast Density Category c: The breast tissue is heterogeneously
dense, which may obscure small masses.
FINDINGS: There are no findings suspicious for malignancy. The images were
evaluated with computer-aided detection.
IMPRESSION: No mammographic evidence of malignancy. A result letter of this
screening mammogram will be mailed directly to the patient.

RECOMMENDATION:
Screening mammogram in one year. (Code:[6J])

BI-RADS CATEGORY  1: Negative.

## 2020-08-11 ENCOUNTER — Ambulatory Visit: Payer: Medicare HMO | Admitting: Physical Therapy

## 2020-08-12 ENCOUNTER — Ambulatory Visit: Payer: Medicare HMO | Admitting: Physical Therapy

## 2020-08-12 ENCOUNTER — Encounter: Payer: Self-pay | Admitting: Physical Therapy

## 2020-08-12 ENCOUNTER — Other Ambulatory Visit: Payer: Self-pay

## 2020-08-12 DIAGNOSIS — M545 Low back pain, unspecified: Secondary | ICD-10-CM

## 2020-08-12 DIAGNOSIS — M6281 Muscle weakness (generalized): Secondary | ICD-10-CM

## 2020-08-12 DIAGNOSIS — R293 Abnormal posture: Secondary | ICD-10-CM | POA: Diagnosis not present

## 2020-08-12 NOTE — Therapy (Signed)
[facility] [address] [place], [place], [address] Phone: [phone]   Fax:  [phone]  Physical Therapy Treatment  Patient Details  Name: Erica Schultz MRN: [id number] Date of Birth: 08/26/1950 Referring Provider (PT): Jennings Books   Encounter Date: 08/12/2020   PT End of Session - 08/12/20 1615    Visit Number 3    Number of Visits 13    Date for PT Re-Evaluation 09/01/20    Authorization Type IE 07/21/20    PT Start Time 1608    PT Stop Time 1655    PT Time Calculation (min) 47 min    Activity Tolerance Patient tolerated treatment well    Behavior During Therapy [facility] for tasks assessed/performed           Past Medical History:  Diagnosis Date  . Hypertension     Past Surgical History:  Procedure Laterality Date  . BREAST CYST ASPIRATION Right 2001  . UMBILICAL HERNIA REPAIR  2017    There were no vitals filed for this visit.   Subjective Assessment - 08/12/20 1613    Subjective Patient reports it has been a rough week with little to no sleep 2/2 to persistent cough. Patient states her back pain has been bad and today there is one area on the left that is particularly grabbing and is radiating to her L foot. Patient notes that after last session she had good relief and felt good.    Currently in Pain? Yes    Pain Score 10-Worst pain ever    Pain Location Back          TREATMENT  Manual Therapy: STM and TPR performed to B lumbar mm to allow for decreased tension and pain and improved posture and function  Neuromuscular Re-education: Prone diaphragmatic breathing with VCs and TCs for downregulation of the nervous system and improved management of IAP Quadruped pelvic tilts with coordinated breath for improved lumbar mobility and decreased pain Child's pose with diaphragmatic breath for improved lumbar mobility and decreased pain Quadruped cat/cow with diaphragmatic breath for improved lumbar mobility  and decreased pain Countertop downdog with pelvic tilts to improve lumbar mobility and postural awareness Countertop QL downdog to improved lumbar mobility and postural awareness  Treatments unbilled: None  Patient educated throughout session on appropriate technique and form using multi-modal cueing, HEP, and activity modification. Patient articulated understanding and returned demonstration.  Patient Response to interventions: 3/10 pain  ASSESSMENT Patient presents to clinic with excellent motivation to participate in therapy. Patient demonstrates deficits in pain free spinal ROM, posture, gait, hamstring extensibility, and L hip IR strength. Patient able to achieve clinically significant reduction in pain with gentle movement and manual during today's session and responded positively to all interventions. Patient will benefit from continued skilled therapeutic intervention to address remaining deficits in pain free spinal ROM, posture, gait, hamstring extensibility, and L hip IR strength in order to increase function and improve overall QOL.     PT Long Term Goals - 07/21/20 1645      PT LONG TERM GOAL #1   Title Patient will be independent with HEP in order to improve strength and decrease back pain in order to improve pain-free function at home and work.    Baseline IE: not initiated    Time 6    Period Weeks    Status New    Target Date 09/01/20      PT LONG TERM GOAL #2   Title Patient  will decrease worst back pain (as reported over a given 7 day period) on NPRS by at least 2 points in order to demonstrate clinically significant reduction in back pain.    Baseline IE: 10/10    Time 6    Period Weeks    Status New    Target Date 09/01/20      PT LONG TERM GOAL #3   Title Patient will report "a little bit of difficulty" or "no difficulty" with going up/down 2 flights of stairs in order to safely participate in activities at home and in the community.    Baseline IE: "quite a  bit of difficulty"    Time 6    Period Weeks    Status New    Target Date 09/01/20      PT LONG TERM GOAL #4   Title Patient will demonstrate improved function as evidenced by a score of 50 on FOTO measure for full participation in activities at home and in the community.    Baseline IE: 44    Time 6    Period Weeks    Status New    Target Date 09/01/20      PT LONG TERM GOAL #5   Title Patient will demonstrate understanding of basic self-management/down-regulation of the nervous system for persistent pain condition and stress as evidenced by diaphragmatic breathing without cueing, body scan/progressive relaxation meditation, and improved sleep hygiene in order to transition to independent management of patient's chief complaint: persistent lumbar pain.    Baseline IE: not demonstrated    Time 6    Period Weeks    Status New    Target Date 09/01/20                 Plan - 08/12/20 1617    Clinical Impression Statement Patient presents to clinic with excellent motivation to participate in therapy. Patient demonstrates deficits in pain free spinal ROM, posture, gait, hamstring extensibility, and L hip IR strength. Patient able to achieve clinically significant reduction in pain with gentle movement and manual during today's session and responded positively to all interventions. Patient will benefit from continued skilled therapeutic intervention to address remaining deficits in pain free spinal ROM, posture, gait, hamstring extensibility, and L hip IR strength in order to increase function and improve overall QOL.    Personal Factors and Comorbidities Age;Past/Current Experience;Time since onset of injury/illness/exacerbation;Comorbidity 3+;Profession    Comorbidities HTN, OSA, DM, hyperlipidemia, migraine    Examination-Activity Limitations Squat;Lift;Bend;Stand;Locomotion Level;Carry;Sleep;Sit;Reach Overhead;Stairs    Insurance claims handler;Occupation;Driving    Stability/Clinical Decision Making Evolving/Moderate complexity    Rehab Potential Fair    PT Frequency 2x / week    PT Duration 6 weeks    PT Treatment/Interventions Moist Heat;Cryotherapy;Therapeutic activities;Neuromuscular re-education;Therapeutic exercise;Patient/family education;Balance training;Functional mobility training;Stair training;Gait training;Manual techniques;Taping;Vestibular;Orthotic Fit/Training;ADLs/Self Care Home Management    PT Next Visit Plan quadruped lumbar mobility    PT Home Exercise Plan LBP gentle series    Consulted and Agree with Plan of Care Patient           Patient will benefit from skilled therapeutic intervention in order to improve the following deficits and impairments:  Abnormal gait,Decreased activity tolerance,Decreased coordination,Decreased strength,Postural dysfunction,Improper body mechanics,Pain,Difficulty walking,Decreased balance,Decreased endurance  Visit Diagnosis: Low back pain, unspecified back pain laterality, unspecified chronicity, unspecified whether sciatica present  Abnormal posture  Muscle weakness (generalized)     Problem List There are no problems to display for this patient.  Myles Gip PT, DPT #  [id number]  08/13/2020, 8:31 AM   [facility] [address] [place], [place], [address] Phone: [phone]   Fax:  [phone]  Name: Erica Schultz MRN: [id number] Date of Birth: 09-03-1950

## 2020-08-13 ENCOUNTER — Encounter: Payer: Medicare HMO | Admitting: Physical Therapy

## 2020-08-17 DIAGNOSIS — R053 Chronic cough: Secondary | ICD-10-CM | POA: Diagnosis not present

## 2020-08-17 DIAGNOSIS — M5416 Radiculopathy, lumbar region: Secondary | ICD-10-CM | POA: Diagnosis not present

## 2020-08-18 ENCOUNTER — Other Ambulatory Visit: Payer: Self-pay

## 2020-08-18 ENCOUNTER — Encounter: Payer: Self-pay | Admitting: Physical Therapy

## 2020-08-18 ENCOUNTER — Ambulatory Visit: Payer: Medicare HMO | Admitting: Physical Therapy

## 2020-08-18 DIAGNOSIS — M545 Low back pain, unspecified: Secondary | ICD-10-CM | POA: Diagnosis not present

## 2020-08-18 DIAGNOSIS — M6281 Muscle weakness (generalized): Secondary | ICD-10-CM

## 2020-08-18 DIAGNOSIS — R293 Abnormal posture: Secondary | ICD-10-CM

## 2020-08-18 NOTE — Therapy (Signed)
[place] [facility] [address] [place], [place], [address] Phone: [phone]   Fax:  [phone]  Physical Therapy Treatment  Patient Details  Name: Erica Schultz MRN: [id number] Date of Birth: 08/20/1950 Referring Provider (PT): Jennings Books   Encounter Date: 08/18/2020   PT End of Session - 08/18/20 1319    Visit Number 4    Number of Visits 13    Date for PT Re-Evaluation 09/01/20    Authorization Type IE 07/21/20    PT Start Time 1315    PT Stop Time 1355    PT Time Calculation (min) 40 min    Activity Tolerance Patient tolerated treatment well    Behavior During Therapy [facility] for tasks assessed/performed           Past Medical History:  Diagnosis Date  . Hypertension     Past Surgical History:  Procedure Laterality Date  . BREAST CYST ASPIRATION Right 2001  . UMBILICAL HERNIA REPAIR  2017    There were no vitals filed for this visit.   Subjective Assessment - 08/18/20 1316    Subjective Patient states that her cough has been improved mildly with new medication, but continues to have cough. Patient notes that stretching in the morning has been helping with back pain on standing. Patient has not tried gabapentin for back/nerve pain but plans to when she has a day off.    Currently in Pain? Yes    Pain Score 7     Pain Location Back    Pain Orientation Lower          TREATMENT  Neuromuscular Re-education: Supine diaphragmatic breathing with VCs and TCs for downregulation of the nervous system and improved management of IAP Supine and seated pelvic tilts with coordinated breath for improved lumbar mobility and decreased pain Supine hooklying, TrA activation with exhalation. VCs and TCs to decrease compensatory patterns and minimize aggravation of the lumbar paraspinals.  Treatments unbilled: MHP to lumbar spine during neuromuscular re-education IFC, lumbar region, 21.65mV, 90 degree sweep, 80-100 bps for pain  relief. No skin irritation or signs of burn noted after removal of pads. 20 minutes during neuromuscular re-education.  Patient educated throughout session on appropriate technique and form using multi-modal cueing, HEP, and activity modification. Patient articulated understanding and returned demonstration.  Patient Response to interventions: 0/10 pain  ASSESSMENT Patient presents to clinic with excellent motivation to participate in therapy. Patient demonstrates deficits in pain free spinal ROM, posture, gait, hamstring extensibility, and L hip IR strength. Patient with good coordination of TrA m in supine position during today's session and responded positively to all interventions with good pain modulation with IFC protocol. Patient will benefit from continued skilled therapeutic intervention to address remaining deficits in pain free spinal ROM, posture, gait, hamstring extensibility, and L hip IR strength in order to increase function and improve overall QOL.   PT Long Term Goals - 07/21/20 1645      PT LONG TERM GOAL #1   Title Patient will be independent with HEP in order to improve strength and decrease back pain in order to improve pain-free function at home and work.    Baseline IE: not initiated    Time 6    Period Weeks    Status New    Target Date 09/01/20      PT LONG TERM GOAL #2   Title Patient will decrease worst back pain (as reported over a given 7 day period) on NPRS  by at least 2 points in order to demonstrate clinically significant reduction in back pain.    Baseline IE: 10/10    Time 6    Period Weeks    Status New    Target Date 09/01/20      PT LONG TERM GOAL #3   Title Patient will report "a little bit of difficulty" or "no difficulty" with going up/down 2 flights of stairs in order to safely participate in activities at home and in the community.    Baseline IE: "quite a bit of difficulty"    Time 6    Period Weeks    Status New    Target Date 09/01/20       PT LONG TERM GOAL #4   Title Patient will demonstrate improved function as evidenced by a score of 50 on FOTO measure for full participation in activities at home and in the community.    Baseline IE: 44    Time 6    Period Weeks    Status New    Target Date 09/01/20      PT LONG TERM GOAL #5   Title Patient will demonstrate understanding of basic self-management/down-regulation of the nervous system for persistent pain condition and stress as evidenced by diaphragmatic breathing without cueing, body scan/progressive relaxation meditation, and improved sleep hygiene in order to transition to independent management of patient's chief complaint: persistent lumbar pain.    Baseline IE: not demonstrated    Time 6    Period Weeks    Status New    Target Date 09/01/20                 Plan - 08/18/20 1320    Clinical Impression Statement Patient presents to clinic with excellent motivation to participate in therapy. Patient demonstrates deficits in pain free spinal ROM, posture, gait, hamstring extensibility, and L hip IR strength. Patient with good coordination of TrA m in supine position during today's session and responded positively to all interventions with good pain modulation with IFC protocol. Patient will benefit from continued skilled therapeutic intervention to address remaining deficits in pain free spinal ROM, posture, gait, hamstring extensibility, and L hip IR strength in order to increase function and improve overall QOL.    Personal Factors and Comorbidities Age;Past/Current Experience;Time since onset of injury/illness/exacerbation;Comorbidity 3+;Profession    Comorbidities HTN, OSA, DM, hyperlipidemia, migraine    Examination-Activity Limitations Squat;Lift;Bend;Stand;Locomotion Level;Carry;Sleep;Sit;Reach Overhead;Stairs    Transport planner;Occupation;Driving    Stability/Clinical Decision Making  Evolving/Moderate complexity    Rehab Potential Fair    PT Frequency 2x / week    PT Duration 6 weeks    PT Treatment/Interventions Moist Heat;Cryotherapy;Therapeutic activities;Neuromuscular re-education;Therapeutic exercise;Patient/family education;Balance training;Functional mobility training;Stair training;Gait training;Manual techniques;Taping;Vestibular;Orthotic Fit/Training;ADLs/Self Care Home Management    PT Next Visit Plan quadruped lumbar mobility    PT Home Exercise Plan LBP gentle series    Consulted and Agree with Plan of Care Patient           Patient will benefit from skilled therapeutic intervention in order to improve the following deficits and impairments:  Abnormal gait,Decreased activity tolerance,Decreased coordination,Decreased strength,Postural dysfunction,Improper body mechanics,Pain,Difficulty walking,Decreased balance,Decreased endurance  Visit Diagnosis: Low back pain, unspecified back pain laterality, unspecified chronicity, unspecified whether sciatica present  Abnormal posture  Muscle weakness (generalized)     Problem List There are no problems to display for this patient.  Myles Gip PT, DPT [phone]  08/18/2020, 4:20 PM  [place]  [facility] [address] [place], [place], [address] Phone: [phone]   Fax:  [phone]  Name: Erica Schultz MRN: [id number] Date of Birth: 07-24-1950

## 2020-08-20 ENCOUNTER — Encounter: Payer: Medicare HMO | Admitting: Physical Therapy

## 2020-08-25 ENCOUNTER — Ambulatory Visit: Payer: Medicare HMO | Admitting: Physical Therapy

## 2020-08-25 ENCOUNTER — Other Ambulatory Visit: Payer: Self-pay

## 2020-08-25 ENCOUNTER — Encounter: Payer: Self-pay | Admitting: Physical Therapy

## 2020-08-25 DIAGNOSIS — R293 Abnormal posture: Secondary | ICD-10-CM | POA: Diagnosis not present

## 2020-08-25 DIAGNOSIS — M545 Low back pain, unspecified: Secondary | ICD-10-CM

## 2020-08-25 DIAGNOSIS — M6281 Muscle weakness (generalized): Secondary | ICD-10-CM | POA: Diagnosis not present

## 2020-08-25 NOTE — Therapy (Signed)
[facility] [address] [place], [place], [address] Phone: [phone]   Fax:  [phone]  Physical Therapy Treatment  Patient Details  Name: Charliene Inoue MRN: [id number] Date of Birth: [birth year] Referring Provider (PT): Jennings Books   Encounter Date: 08/25/2020   PT End of Session - 08/25/20 1317    Visit Number 5    Number of Visits 13    Date for PT Re-Evaluation 09/01/20    Authorization Type IE 07/21/20    PT Start Time 2633    PT Stop Time 1355    PT Time Calculation (min) 42 min    Activity Tolerance Patient tolerated treatment well    Behavior During Therapy [facility] for tasks assessed/performed           Past Medical History:  Diagnosis Date  . Hypertension     Past Surgical History:  Procedure Laterality Date  . BREAST CYST ASPIRATION Right 2001  . UMBILICAL HERNIA REPAIR  2017    There were no vitals filed for this visit.   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 Patient notes she is in a high level of pain. Patient states that after last session she had 24 hours of relief. Patient states that right now she feels she just needs to go lay down and sleep.    Currently in Pain? Yes    Pain Score 10-Worst pain ever   25/10   Pain Location Back    Pain Orientation Lower           TREATMENT  Neuromuscular Re-education: Supine diaphragmatic breathing with VCs and TCs for downregulation of the nervous system and improved management of IAP Supine pelvic tilts with coordinated breath for improved lumbar mobility and decreased pain Supine hooklying, TrA activation with exhalation. VCs and TCs to decrease compensatory patterns and minimize aggravation of the lumbar paraspinals. Supine hooklying trunk rotations with coordinated breath for improved lumbar mobility and decreased pain Supine single knee to chest with  diaphragmatic breath for improved lumbar mobility and decreased pain, BLE Supine double knee to chest with diaphragmatic breath for improved lumbar mobility and decreased pain   Treatments unbilled: MHP to lumbar spine during neuromuscular re-education IFC, lumbar region, 21.47mV, 90 degree sweep, 80-100 bps for pain relief. No skin irritation or signs of burn noted after removal of pads. 20 minutes during neuromuscular re-education.  Patient educated throughout session on appropriate technique and form using multi-modal cueing, HEP, and activity modification. Patient articulated understanding and returned demonstration.  Patient Response to interventions: 4/10 pain  ASSESSMENT Patient presents to clinic with excellent motivation to participate in therapy. Patient demonstrates deficits in pain free spinal ROM, posture, gait, hamstring extensibility, and L hip IR strength. Patient continues to have significant pain reduction with IFC protocol combined with gentle movement during today's session and responded positively to all interventions. Patient will benefit from continued skilled therapeutic intervention to address remaining deficits in pain free spinal ROM, posture, gait, hamstring extensibility, and L hip IR strength in order to increase function and improve overall QOL.      PT Long Term Goals - 07/21/20 1645      PT LONG TERM GOAL #1   Title Patient will be independent with HEP in order to improve strength and decrease back pain in order to improve pain-free function at home and work.    Baseline IE: not initiated  Time 6    Period Weeks    Status New    Target Date 09/01/20      PT LONG TERM GOAL #2   Title Patient will decrease worst back pain (as reported over a given 7 day period) on NPRS by at least 2 points in order to demonstrate clinically significant reduction in back pain.    Baseline IE: 10/10    Time 6    Period Weeks    Status New    Target Date 09/01/20       PT LONG TERM GOAL #3   Title Patient will report "a little bit of difficulty" or "no difficulty" with going up/down 2 flights of stairs in order to safely participate in activities at home and in the community.    Baseline IE: "quite a bit of difficulty"    Time 6    Period Weeks    Status New    Target Date 09/01/20      PT LONG TERM GOAL #4   Title Patient will demonstrate improved function as evidenced by a score of 50 on FOTO measure for full participation in activities at home and in the community.    Baseline IE: 44    Time 6    Period Weeks    Status New    Target Date 09/01/20      PT LONG TERM GOAL #5   Title Patient will demonstrate understanding of basic self-management/down-regulation of the nervous system for persistent pain condition and stress as evidenced by diaphragmatic breathing without cueing, body scan/progressive relaxation meditation, and improved sleep hygiene in order to transition to independent management of patient's chief complaint: persistent lumbar pain.    Baseline IE: not demonstrated    Time 6    Period Weeks    Status New    Target Date 09/01/20                 Plan - 08/25/20 1513    Clinical Impression Statement Patient presents to clinic with excellent motivation to participate in therapy. Patient demonstrates deficits in pain free spinal ROM, posture, gait, hamstring extensibility, and L hip IR strength. Patient continues to have significant pain reduction with IFC protocol combined with gentle movement during today's session and responded positively to all interventions. Patient will benefit from continued skilled therapeutic intervention to address remaining deficits in pain free spinal ROM, posture, gait, hamstring extensibility, and L hip IR strength in order to increase function and improve overall QOL.    Personal Factors and Comorbidities Age;Past/Current Experience;Time since onset of injury/illness/exacerbation;Comorbidity  3+;Profession    Comorbidities HTN, OSA, DM, hyperlipidemia, migraine    Examination-Activity Limitations Squat;Lift;Bend;Stand;Locomotion Level;Carry;Sleep;Sit;Reach Overhead;Stairs    Transport planner;Occupation;Driving    Stability/Clinical Decision Making Evolving/Moderate complexity    Rehab Potential Fair    PT Frequency 2x / week    PT Duration 6 weeks    PT Treatment/Interventions Moist Heat;Cryotherapy;Therapeutic activities;Neuromuscular re-education;Therapeutic exercise;Patient/family education;Balance training;Functional mobility training;Stair training;Gait training;Manual techniques;Taping;Vestibular;Orthotic Fit/Training;ADLs/Self Care Home Management    PT Next Visit Plan quadruped lumbar mobility    PT Home Exercise Plan LBP gentle series    Consulted and Agree with Plan of Care Patient           Patient will benefit from skilled therapeutic intervention in order to improve the following deficits and impairments:  Abnormal gait,Decreased activity tolerance,Decreased coordination,Decreased strength,Postural dysfunction,Improper body mechanics,Pain,Difficulty walking,Decreased balance,Decreased endurance  Visit Diagnosis: Low back pain, unspecified back pain laterality, unspecified  chronicity, unspecified whether sciatica present  Abnormal posture  Muscle weakness (generalized)     Problem List There are no problems to display for this patient.  Myles Gip PT, DPT [phone] 08/25/2020, 3:18 PM  [place] [facility] [address] [place], [place], [address] Phone: [phone]   Fax:  [phone]  Name: Ryver Zadrozny MRN: [id number] Date of Birth: [birth year]

## 2020-08-27 ENCOUNTER — Encounter: Payer: Medicare HMO | Admitting: Physical Therapy

## 2020-08-27 DIAGNOSIS — N281 Cyst of kidney, acquired: Secondary | ICD-10-CM | POA: Diagnosis not present

## 2020-08-27 DIAGNOSIS — R200 Anesthesia of skin: Secondary | ICD-10-CM | POA: Diagnosis not present

## 2020-08-27 DIAGNOSIS — R202 Paresthesia of skin: Secondary | ICD-10-CM | POA: Diagnosis not present

## 2020-09-01 ENCOUNTER — Encounter: Payer: Self-pay | Admitting: Physical Therapy

## 2020-09-01 ENCOUNTER — Ambulatory Visit: Payer: Medicare HMO | Attending: Neurology | Admitting: Physical Therapy

## 2020-09-01 ENCOUNTER — Other Ambulatory Visit: Payer: Self-pay

## 2020-09-01 DIAGNOSIS — R293 Abnormal posture: Secondary | ICD-10-CM | POA: Insufficient documentation

## 2020-09-01 DIAGNOSIS — M545 Low back pain, unspecified: Secondary | ICD-10-CM | POA: Diagnosis not present

## 2020-09-01 DIAGNOSIS — M6281 Muscle weakness (generalized): Secondary | ICD-10-CM | POA: Diagnosis not present

## 2020-09-01 NOTE — Therapy (Signed)
[place] [facility] [address] [place], [place], [address] Phone: [phone]   Fax:  [phone]  Physical Therapy Treatment  Patient Details  Name: Erica Schultz MRN: [id number] Date of Birth: 05-18-1951 Referring Provider (PT): Jennings Books   Encounter Date: 09/01/2020   PT End of Session - 09/01/20 1524    Visit Number 6    Number of Visits 13    Date for PT Re-Evaluation 09/01/20    Authorization Type IE 07/21/20    PT Start Time 1415    PT Stop Time 1510    PT Time Calculation (min) 55 min    Activity Tolerance Patient tolerated treatment well    Behavior During Therapy [facility] for tasks assessed/performed           Past Medical History:  Diagnosis Date  . Hypertension     Past Surgical History:  Procedure Laterality Date  . BREAST CYST ASPIRATION Right 2001  . UMBILICAL HERNIA REPAIR  2017    There were no vitals filed for this visit.   Subjective Assessment - 09/01/20 1430    Subjective Patient notes some frustration with her pain in response to the physical demands of her job. Patient starts the workday at a 3 or 4/10 but by the end of a shift she is at 9/10. Patient notes that she does feel relief from stretches and gentle movement, but when at work she is not able to modify her activities to accomodate her back pain. Patient has increased pain with bed mobility which will wake her up in the middle of the night. Patient is unsure if this is related to her mattress.    Currently in Pain? Yes    Pain Score 9     Pain Location Back           TREATMENT  Neuromuscular Re-education: Supine diaphragmatic breathing with VCs and TCs for downregulation of the nervous system and improved management of IAP Supine pelvic tilts with coordinated breath for improved lumbar mobility and decreased pain Supine figure 4 stretch with diaphragmatic breath for decreased pain and improved posture. BLE Supine hooklying trunk rotations  with coordinated breath for improved lumbar mobility and decreased pain Supine single knee to chest with diaphragmatic breath for improved lumbar mobility and decreased pain, BLE Supine double knee to chest with diaphragmatic breath for improved lumbar mobility and decreased pain Guided imagery with body scan in supported hooklying for improved pain coping skills.  Treatments unbilled: MHP to lumbar spine during neuromuscular re-education IFC, lumbar region, 22.21mV, 90 degree sweep, 80-100 bps for pain relief. No skin irritation or signs of burn noted after removal of pads. 20 minutes during neuromuscular re-education.  Patient educated throughout session on appropriate technique and form using multi-modal cueing, HEP, and activity modification. Patient articulated understanding and returned demonstration.  Patient Response to interventions: 1-2/10 pain  ASSESSMENT Patient presents to clinic with excellent motivation to participate in therapy. Patient demonstrates deficits in pain free spinal ROM, posture, gait, hamstring extensibility, and L hip IR strength. Patient had significant pain reduction with guided imagery and body scan pain coping skills training during today's session and responded positively to all interventions. Patient will benefit from continued skilled therapeutic intervention to address remaining deficits in pain free spinal ROM, posture, gait, hamstring extensibility, and L hip IR strength in order to increase function and improve overall QOL.      PT Long Term Goals - 07/21/20 1645  PT LONG TERM GOAL #1   Title Patient will be independent with HEP in order to improve strength and decrease back pain in order to improve pain-free function at home and work.    Baseline IE: not initiated    Time 6    Period Weeks    Status New    Target Date 09/01/20      PT LONG TERM GOAL #2   Title Patient will decrease worst back pain (as reported over a given 7 day period) on  NPRS by at least 2 points in order to demonstrate clinically significant reduction in back pain.    Baseline IE: 10/10    Time 6    Period Weeks    Status New    Target Date 09/01/20      PT LONG TERM GOAL #3   Title Patient will report "a little bit of difficulty" or "no difficulty" with going up/down 2 flights of stairs in order to safely participate in activities at home and in the community.    Baseline IE: "quite a bit of difficulty"    Time 6    Period Weeks    Status New    Target Date 09/01/20      PT LONG TERM GOAL #4   Title Patient will demonstrate improved function as evidenced by a score of 50 on FOTO measure for full participation in activities at home and in the community.    Baseline IE: 44    Time 6    Period Weeks    Status New    Target Date 09/01/20      PT LONG TERM GOAL #5   Title Patient will demonstrate understanding of basic self-management/down-regulation of the nervous system for persistent pain condition and stress as evidenced by diaphragmatic breathing without cueing, body scan/progressive relaxation meditation, and improved sleep hygiene in order to transition to independent management of patient's chief complaint: persistent lumbar pain.    Baseline IE: not demonstrated    Time 6    Period Weeks    Status New    Target Date 09/01/20                 Plan - 09/01/20 1524    Clinical Impression Statement Patient presents to clinic with excellent motivation to participate in therapy. Patient demonstrates deficits in pain free spinal ROM, posture, gait, hamstring extensibility, and L hip IR strength. Patient had significant pain reduction with guided imagery and body scan pain coping skills training during today's session and responded positively to all interventions. Patient will benefit from continued skilled therapeutic intervention to address remaining deficits in pain free spinal ROM, posture, gait, hamstring extensibility, and L hip IR  strength in order to increase function and improve overall QOL.    Personal Factors and Comorbidities Age;Past/Current Experience;Time since onset of injury/illness/exacerbation;Comorbidity 3+;Profession    Comorbidities HTN, OSA, DM, hyperlipidemia, migraine    Examination-Activity Limitations Squat;Lift;Bend;Stand;Locomotion Level;Carry;Sleep;Sit;Reach Overhead;Stairs    Transport planner;Occupation;Driving    Stability/Clinical Decision Making Evolving/Moderate complexity    Rehab Potential Fair    PT Frequency 2x / week    PT Duration 6 weeks    PT Treatment/Interventions Moist Heat;Cryotherapy;Therapeutic activities;Neuromuscular re-education;Therapeutic exercise;Patient/family education;Balance training;Functional mobility training;Stair training;Gait training;Manual techniques;Taping;Vestibular;Orthotic Fit/Training;ADLs/Self Care Home Management    PT Next Visit Plan quadruped lumbar mobility    PT Home Exercise Plan LBP gentle series    Consulted and Agree with Plan of Care Patient  Patient will benefit from skilled therapeutic intervention in order to improve the following deficits and impairments:  Abnormal gait,Decreased activity tolerance,Decreased coordination,Decreased strength,Postural dysfunction,Improper body mechanics,Pain,Difficulty walking,Decreased balance,Decreased endurance  Visit Diagnosis: Low back pain, unspecified back pain laterality, unspecified chronicity, unspecified whether sciatica present  Abnormal posture  Muscle weakness (generalized)     Problem List There are no problems to display for this patient.  Myles Gip PT, DPT [phone] 09/01/2020, 3:30 PM  [facility] [address] [place], [place], [address] Phone: [phone]   Fax:  [phone]  Name: Erica Schultz MRN: [id number] Date of Birth: 24-Mar-1951

## 2020-09-06 DIAGNOSIS — G4733 Obstructive sleep apnea (adult) (pediatric): Secondary | ICD-10-CM | POA: Diagnosis not present

## 2020-09-08 ENCOUNTER — Encounter: Payer: Self-pay | Admitting: Physical Therapy

## 2020-09-08 ENCOUNTER — Ambulatory Visit: Payer: Medicare HMO | Admitting: Physical Therapy

## 2020-09-08 ENCOUNTER — Other Ambulatory Visit: Payer: Self-pay

## 2020-09-08 DIAGNOSIS — M6281 Muscle weakness (generalized): Secondary | ICD-10-CM | POA: Diagnosis not present

## 2020-09-08 DIAGNOSIS — M545 Low back pain, unspecified: Secondary | ICD-10-CM | POA: Diagnosis not present

## 2020-09-08 DIAGNOSIS — R293 Abnormal posture: Secondary | ICD-10-CM | POA: Diagnosis not present

## 2020-09-08 NOTE — Therapy (Signed)
[place] [facility] [address] [place], [place], [address] Phone: [phone]   Fax:  [phone]  Physical Therapy Treatment  Patient Details  Name: Erica Schultz MRN: [id number] Date of Birth: 03/09/1951 Referring Provider (PT): Jennings Books   Encounter Date: 09/08/2020   PT End of Session - 09/08/20 1718    Visit Number 7    Number of Visits 19    Date for PT Re-Evaluation 10/20/20    Authorization Type IE 07/21/20    PT Start Time 1415    PT Stop Time 1500    PT Time Calculation (min) 45 min    Activity Tolerance Patient tolerated treatment well    Behavior During Therapy [facility] for tasks assessed/performed           Past Medical History:  Diagnosis Date  . Hypertension     Past Surgical History:  Procedure Laterality Date  . BREAST CYST ASPIRATION Right 2001  . UMBILICAL HERNIA REPAIR  2017    There were no vitals filed for this visit.   Subjective Assessment - 09/08/20 1716    Subjective Patient notes that she has been havign more good days with respect to back pain management, but continues to be bothered by the L SIJ region. Patient now has home TENS unit and heating pad for pain modulation after working; she has used these one time and did not note any issues and had some relief. Patient has been doing home exercises and getting continued relief from them. Patient denies any other concerns at this time.    Currently in Pain? Yes    Pain Score 7     Pain Location Back           TREATMENT  Neuromuscular Re-education: Supine diaphragmatic breathing with VCs and TCs for downregulation of the nervous system and improved management of IAP Supine pelvic tilts with coordinated breath for improved lumbar mobility and decreased pain Supine figure 4 stretch with diaphragmatic breath for decreased pain and improved posture. BLE Supine hooklying hamstring stretch for improved lumbar mobility and decreased pain Supine  single knee to chest with diaphragmatic breath for improved lumbar mobility and decreased pain, BLE Supine hooklying TrA activation with TCs for DRAM closure with coordinated breath for improved trunk stabilization  Treatments unbilled: MHP to lumbar spine during neuromuscular re-education IFC, lumbar region, 22.30mV, 90 degree sweep, 80-100 bps for pain relief. No skin irritation or signs of burn noted after removal of pads. 20 minutes during neuromuscular re-education.  Patient educated throughout session on appropriate technique and form using multi-modal cueing, HEP, and activity modification. Patient articulated understanding and returned demonstration.  Patient Response to interventions: 2/10 pain  ASSESSMENT Patient presents to clinic with excellent motivation to participate in therapy. Patient demonstrates deficits in pain free spinal ROM, posture, gait, hamstring extensibility, and L hip IR strength. Patient continues to respond well within the session with a 5 point pain reduction during today's session and responded positively to all interventions. Patient has made progress toward all goals and demonstrates excellent application of HEP for self-management between visits. Patient's condition has the potential to improve in response to therapy. Maximum improvement is yet to be obtained. The anticipated improvement is attainable and reasonable in a generally predictable time. Patient will benefit from continued skilled therapeutic intervention to address remaining deficits in pain free spinal ROM, posture, gait, hamstring extensibility, and L hip IR strength in order to increase function and improve overall QOL.  PT Long Term Goals - 09/08/20 1719      PT LONG TERM GOAL #1   Title Patient will be independent with HEP in order to improve strength and decrease back pain in order to improve pain-free function at home and work.    Baseline IE: not initiated; 4/12: IND    Time 6    Period  Weeks    Status Achieved      PT LONG TERM GOAL #2   Title Patient will decrease worst back pain (as reported over a given 7 day period) on NPRS by at least 2 points in order to demonstrate clinically significant reduction in back pain.    Baseline IE: 10/10; 4/12: 9/10    Time 6    Period Weeks    Status On-going    Target Date 10/20/20      PT LONG TERM GOAL #3   Title Patient will report "a little bit of difficulty" or "no difficulty" with going up/down 2 flights of stairs in order to safely participate in activities at home and in the community.    Baseline IE: "quite a bit of difficulty"; 4/12: not assessed    Time 6    Period Weeks    Status On-going    Target Date 10/20/20      PT LONG TERM GOAL #4   Title Patient will demonstrate improved function as evidenced by a score of 50 on FOTO measure for full participation in activities at home and in the community.    Baseline IE: 44; 4/12: not assessed    Time 6    Period Weeks    Status On-going    Target Date 10/20/20      PT LONG TERM GOAL #5   Title Patient will demonstrate understanding of basic self-management/down-regulation of the nervous system for persistent pain condition and stress as evidenced by diaphragmatic breathing without cueing, body scan/progressive relaxation meditation, and improved sleep hygiene in order to transition to independent management of patient's chief complaint: persistent lumbar pain.    Baseline IE: not demonstrated; 4/12: practicing body scan and diaphragmatic breathing at home    Time 6    Period Weeks    Status Partially Met    Target Date 10/20/20                 Plan - 09/08/20 1719    Clinical Impression Statement Patient presents to clinic with excellent motivation to participate in therapy. Patient demonstrates deficits in pain free spinal ROM, posture, gait, hamstring extensibility, and L hip IR strength. Patient continues to respond well within the session with a 5 point  pain reduction during today's session and responded positively to all interventions. Patient has made progress toward all goals and demonstrates excellent application of HEP for self-management between visits. Patient's condition has the potential to improve in response to therapy. Maximum improvement is yet to be obtained. The anticipated improvement is attainable and reasonable in a generally predictable time. Patient will benefit from continued skilled therapeutic intervention to address remaining deficits in pain free spinal ROM, posture, gait, hamstring extensibility, and L hip IR strength in order to increase function and improve overall QOL.    Personal Factors and Comorbidities Age;Past/Current Experience;Time since onset of injury/illness/exacerbation;Comorbidity 3+;Profession    Comorbidities HTN, OSA, DM, hyperlipidemia, migraine    Examination-Activity Limitations Squat;Lift;Bend;Stand;Locomotion Level;Carry;Sleep;Sit;Reach Overhead;Stairs    Transport planner;Occupation;Driving    Stability/Clinical Decision Making Evolving/Moderate complexity    Rehab Potential Fair  PT Frequency 2x / week    PT Duration 6 weeks    PT Treatment/Interventions Moist Heat;Cryotherapy;Therapeutic activities;Neuromuscular re-education;Therapeutic exercise;Patient/family education;Balance training;Functional mobility training;Stair training;Gait training;Manual techniques;Taping;Vestibular;Orthotic Fit/Training;ADLs/Self Care Home Management    PT Home Exercise Plan LBP gentle series    Consulted and Agree with Plan of Care Patient           Patient will benefit from skilled therapeutic intervention in order to improve the following deficits and impairments:  Abnormal gait,Decreased activity tolerance,Decreased coordination,Decreased strength,Postural dysfunction,Improper body mechanics,Pain,Difficulty walking,Decreased balance,Decreased  endurance  Visit Diagnosis: Low back pain, unspecified back pain laterality, unspecified chronicity, unspecified whether sciatica present  Abnormal posture  Muscle weakness (generalized)     Problem List There are no problems to display for this patient.  Myles Gip PT, DPT [phone]  09/08/2020, 5:29 PM  [place] [facility] [address] [place], [place], [address] Phone: [phone]   Fax:  [phone]  Name: Erica Schultz MRN: [id number] Date of Birth: 28-Jan-1951

## 2020-09-09 DIAGNOSIS — R059 Cough, unspecified: Secondary | ICD-10-CM | POA: Diagnosis not present

## 2020-09-09 DIAGNOSIS — K219 Gastro-esophageal reflux disease without esophagitis: Secondary | ICD-10-CM | POA: Diagnosis not present

## 2020-09-15 ENCOUNTER — Ambulatory Visit: Payer: Medicare HMO | Admitting: Physical Therapy

## 2020-09-15 ENCOUNTER — Encounter: Payer: Self-pay | Admitting: Physical Therapy

## 2020-09-15 ENCOUNTER — Other Ambulatory Visit: Payer: Self-pay

## 2020-09-15 DIAGNOSIS — M6281 Muscle weakness (generalized): Secondary | ICD-10-CM

## 2020-09-15 DIAGNOSIS — M545 Low back pain, unspecified: Secondary | ICD-10-CM | POA: Diagnosis not present

## 2020-09-15 DIAGNOSIS — R293 Abnormal posture: Secondary | ICD-10-CM | POA: Diagnosis not present

## 2020-09-15 NOTE — Therapy (Signed)
[place] [facility] [address] [place], [place], [address] Phone: [phone]   Fax:  [phone]  Physical Therapy Treatment  Patient Details  Name: Erica Schultz MRN: [id number] Date of Birth: 06-26-1950 Referring Provider (PT): Jennings Books   Encounter Date: 09/15/2020   PT End of Session - 09/15/20 1423    Visit Number 8    Number of Visits 19    Date for PT Re-Evaluation 10/20/20    Authorization Type IE 07/21/20    PT Start Time 1415    PT Stop Time 1500    PT Time Calculation (min) 45 min    Activity Tolerance Patient tolerated treatment well    Behavior During Therapy [facility] for tasks assessed/performed           Past Medical History:  Diagnosis Date  . Hypertension     Past Surgical History:  Procedure Laterality Date  . BREAST CYST ASPIRATION Right 2001  . UMBILICAL HERNIA REPAIR  2017    There were no vitals filed for this visit.   Subjective Assessment - 09/15/20 1419    Subjective Patient notes that she was in a lot of pain yesterday after working longer hours without help. Patient used heating pad and tried to use TENs unit to modulate pain. Patient continues to get good relief from home exercises.    Currently in Pain? Yes    Pain Score 5     Pain Location Back           TREATMENT  Neuromuscular Re-education: Supine diaphragmatic breathing with VCs and TCs for downregulation of the nervous system and improved management of IAP Supine hooklying TrA activation with TCs for DRAM closure with coordinated breath for improved trunk stabilization Supine marches with TrA, coordinated breath for improved postural control Supine UE fly with resistance band and TrA with coordinated breath for improved postural control  Treatments unbilled: MHP to lumbar spine during neuromuscular re-education IFC, lumbar region, 23.23mV, 90 degree sweep, 80-100 bps for pain relief. No skin irritation or signs of burn noted  after removal of pads. 20 minutes during neuromuscular re-education.  Patient educated throughout session on appropriate technique and form using multi-modal cueing, HEP, and activity modification. Patient articulated understanding and returned demonstration.  Patient Response to interventions: 2/10 pain  ASSESSMENT Patient presents to clinic with excellent motivation to participate in therapy. Patient demonstrates deficits in pain free spinal ROM, posture, gait, hamstring extensibility, and L hip IR strength. Patient tolerating increased active interventions with load challenge during today's session and responded positively to all interventions. Patient will benefit from continued skilled therapeutic intervention to address remaining deficits in pain free spinal ROM, posture, gait, hamstring extensibility, and L hip IR strength in order to increase function and improve overall QOL.     PT Long Term Goals - 09/08/20 1719      PT LONG TERM GOAL #1   Title Patient will be independent with HEP in order to improve strength and decrease back pain in order to improve pain-free function at home and work.    Baseline IE: not initiated; 4/12: IND    Time 6    Period Weeks    Status Achieved      PT LONG TERM GOAL #2   Title Patient will decrease worst back pain (as reported over a given 7 day period) on NPRS by at least 2 points in order to demonstrate clinically significant reduction in back pain.    Baseline IE:  10/10; 4/12: 9/10    Time 6    Period Weeks    Status On-going    Target Date 10/20/20      PT LONG TERM GOAL #3   Title Patient will report "a little bit of difficulty" or "no difficulty" with going up/down 2 flights of stairs in order to safely participate in activities at home and in the community.    Baseline IE: "quite a bit of difficulty"; 4/12: not assessed    Time 6    Period Weeks    Status On-going    Target Date 10/20/20      PT LONG TERM GOAL #4   Title Patient  will demonstrate improved function as evidenced by a score of 50 on FOTO measure for full participation in activities at home and in the community.    Baseline IE: 44; 4/12: not assessed    Time 6    Period Weeks    Status On-going    Target Date 10/20/20      PT LONG TERM GOAL #5   Title Patient will demonstrate understanding of basic self-management/down-regulation of the nervous system for persistent pain condition and stress as evidenced by diaphragmatic breathing without cueing, body scan/progressive relaxation meditation, and improved sleep hygiene in order to transition to independent management of patient's chief complaint: persistent lumbar pain.    Baseline IE: not demonstrated; 4/12: practicing body scan and diaphragmatic breathing at home    Time 6    Period Weeks    Status Partially Met    Target Date 10/20/20                 Plan - 09/15/20 1423    Clinical Impression Statement Patient presents to clinic with excellent motivation to participate in therapy. Patient demonstrates deficits in pain free spinal ROM, posture, gait, hamstring extensibility, and L hip IR strength. Patient tolerating increased active interventions with load challenge during today's session and responded positively to all interventions. Patient will benefit from continued skilled therapeutic intervention to address remaining deficits in pain free spinal ROM, posture, gait, hamstring extensibility, and L hip IR strength in order to increase function and improve overall QOL.    Personal Factors and Comorbidities Age;Past/Current Experience;Time since onset of injury/illness/exacerbation;Comorbidity 3+;Profession    Comorbidities HTN, OSA, DM, hyperlipidemia, migraine    Examination-Activity Limitations Squat;Lift;Bend;Stand;Locomotion Level;Carry;Sleep;Sit;Reach Overhead;Stairs    Transport planner;Occupation;Driving    Stability/Clinical  Decision Making Evolving/Moderate complexity    Rehab Potential Fair    PT Frequency 2x / week    PT Duration 6 weeks    PT Treatment/Interventions Moist Heat;Cryotherapy;Therapeutic activities;Neuromuscular re-education;Therapeutic exercise;Patient/family education;Balance training;Functional mobility training;Stair training;Gait training;Manual techniques;Taping;Vestibular;Orthotic Fit/Training;ADLs/Self Care Home Management    PT Home Exercise Plan LBP gentle series with TrA basics    Consulted and Agree with Plan of Care Patient           Patient will benefit from skilled therapeutic intervention in order to improve the following deficits and impairments:  Abnormal gait,Decreased activity tolerance,Decreased coordination,Decreased strength,Postural dysfunction,Improper body mechanics,Pain,Difficulty walking,Decreased balance,Decreased endurance  Visit Diagnosis: Low back pain, unspecified back pain laterality, unspecified chronicity, unspecified whether sciatica present  Abnormal posture  Muscle weakness (generalized)     Problem List There are no problems to display for this patient.  Myles Gip PT, DPT [phone]  09/15/2020, 3:25 PM  [place] [facility] [address] [place], [place], [address] Phone: [phone]   Fax:  [phone]  Name:  Erica Schultz MRN: [id number] Date of Birth: [birth year]

## 2020-09-22 ENCOUNTER — Other Ambulatory Visit: Payer: Self-pay

## 2020-09-22 ENCOUNTER — Encounter: Payer: Self-pay | Admitting: Physical Therapy

## 2020-09-22 ENCOUNTER — Ambulatory Visit: Payer: Medicare HMO | Admitting: Physical Therapy

## 2020-09-22 DIAGNOSIS — M545 Low back pain, unspecified: Secondary | ICD-10-CM

## 2020-09-22 DIAGNOSIS — M6281 Muscle weakness (generalized): Secondary | ICD-10-CM

## 2020-09-22 DIAGNOSIS — R293 Abnormal posture: Secondary | ICD-10-CM

## 2020-09-22 NOTE — Therapy (Signed)
[place] [facility] [address] [place], [place], [address] Phone: [phone]   Fax:  [phone]  Physical Therapy Treatment  Patient Details  Name: Erica Schultz MRN: [id number] Date of Birth: 22-Nov-1950 Referring Provider (PT): Jennings Books   Encounter Date: 09/22/2020   PT End of Session - 09/22/20 1421    Visit Number 9    Number of Visits 19    Date for PT Re-Evaluation 10/20/20    Authorization Type IE 07/21/20    PT Start Time 1415    PT Stop Time 1500    PT Time Calculation (min) 45 min    Activity Tolerance Patient tolerated treatment well    Behavior During Therapy [facility] for tasks assessed/performed           Past Medical History:  Diagnosis Date  . Hypertension     Past Surgical History:  Procedure Laterality Date  . BREAST CYST ASPIRATION Right 2001  . UMBILICAL HERNIA REPAIR  2017    There were no vitals filed for this visit.   Subjective Assessment - 09/22/20 1418    Subjective Patient notes that back pain is better today than yesterday. Patient notes that she is only able to get out of bed in the morning with use of stretches and breathing exercises. Patient has been using TENS unit with mixed effect.    Currently in Pain? Yes    Pain Score 4     Pain Location Back    Pain Orientation Lower    Pain Descriptors / Indicators Aching           TREATMENT  Neuromuscular Re-education: Supine diaphragmatic breathing with VCs and TCs for downregulation of the nervous system and improved management of IAP Supine hooklying TrA activation with TCs for DRAM closure with coordinated breath for improved trunk stabilization Supine pelvic tilts with coordinated breath for improved lumbar mobility and decreased pain Supine hooklying trunk rotations with coordinated breath for improved lumbar mobility and decreased pain Supine single knee to chest with diaphragmatic breath for improved lumbar mobility and decreased  pain, BLE Supine double knee to chest with diaphragmatic breath for improved lumbar mobility and decreased pain Standing Pilates Postural Control Hug a Tree, RTB   Serve a Tray, RTB   Treatments unbilled: MHP to lumbar spine during neuromuscular re-education IFC, lumbar region, 23.77m, 90 degree sweep, 80-100 bps for pain relief. No skin irritation or signs of burn noted after removal of pads. 20 minutes during neuromuscular re-education.  Patient educated throughout session on appropriate technique and form using multi-modal cueing, HEP, and activity modification. Patient articulated understanding and returned demonstration.  Patient Response to interventions: Notes on increased pain.  ASSESSMENT Patient presents to clinic with excellent motivation to participate in therapy. Patient demonstrates deficits in pain free spinal ROM, posture, gait, hamstring extensibility, and L hip IR strength. Patient able to perform standing postural control interventions with good form during today's session and responded positively to all interventions. Patient will benefit from continued skilled therapeutic intervention to address remaining deficits in pain free spinal ROM, posture, gait, hamstring extensibility, and L hip IR strength in order to increase function and improve overall QOL.      PT Long Term Goals - 09/08/20 1719      PT LONG TERM GOAL #1   Title Patient will be independent with HEP in order to improve strength and decrease back pain in order to improve pain-free function at home and work.  Baseline IE: not initiated; 4/12: IND    Time 6    Period Weeks    Status Achieved      PT LONG TERM GOAL #2   Title Patient will decrease worst back pain (as reported over a given 7 day period) on NPRS by at least 2 points in order to demonstrate clinically significant reduction in back pain.    Baseline IE: 10/10; 4/12: 9/10    Time 6    Period Weeks    Status On-going    Target Date  10/20/20      PT LONG TERM GOAL #3   Title Patient will report "a little bit of difficulty" or "no difficulty" with going up/down 2 flights of stairs in order to safely participate in activities at home and in the community.    Baseline IE: "quite a bit of difficulty"; 4/12: not assessed    Time 6    Period Weeks    Status On-going    Target Date 10/20/20      PT LONG TERM GOAL #4   Title Patient will demonstrate improved function as evidenced by a score of 50 on FOTO measure for full participation in activities at home and in the community.    Baseline IE: 44; 4/12: not assessed    Time 6    Period Weeks    Status On-going    Target Date 10/20/20      PT LONG TERM GOAL #5   Title Patient will demonstrate understanding of basic self-management/down-regulation of the nervous system for persistent pain condition and stress as evidenced by diaphragmatic breathing without cueing, body scan/progressive relaxation meditation, and improved sleep hygiene in order to transition to independent management of patient's chief complaint: persistent lumbar pain.    Baseline IE: not demonstrated; 4/12: practicing body scan and diaphragmatic breathing at home    Time 6    Period Weeks    Status Partially Met    Target Date 10/20/20                 Plan - 09/22/20 1829    Clinical Impression Statement Patient presents to clinic with excellent motivation to participate in therapy. Patient demonstrates deficits in pain free spinal ROM, posture, gait, hamstring extensibility, and L hip IR strength. Patient able to perform standing postural control interventions with good form during today's session and responded positively to all interventions. Patient will benefit from continued skilled therapeutic intervention to address remaining deficits in pain free spinal ROM, posture, gait, hamstring extensibility, and L hip IR strength in order to increase function and improve overall QOL.    Personal Factors  and Comorbidities Age;Past/Current Experience;Time since onset of injury/illness/exacerbation;Comorbidity 3+;Profession    Comorbidities HTN, OSA, DM, hyperlipidemia, migraine    Examination-Activity Limitations Squat;Lift;Bend;Stand;Locomotion Level;Carry;Sleep;Sit;Reach Overhead;Stairs    Transport planner;Occupation;Driving    Stability/Clinical Decision Making Evolving/Moderate complexity    Rehab Potential Fair    PT Frequency 2x / week    PT Duration 6 weeks    PT Treatment/Interventions Moist Heat;Cryotherapy;Therapeutic activities;Neuromuscular re-education;Therapeutic exercise;Patient/family education;Balance training;Functional mobility training;Stair training;Gait training;Manual techniques;Taping;Vestibular;Orthotic Fit/Training;ADLs/Self Care Home Management    PT Home Exercise Plan LBP gentle series with TrA basics    Consulted and Agree with Plan of Care Patient           Patient will benefit from skilled therapeutic intervention in order to improve the following deficits and impairments:  Abnormal gait,Decreased activity tolerance,Decreased coordination,Decreased strength,Postural dysfunction,Improper body mechanics,Pain,Difficulty walking,Decreased balance,Decreased endurance  Visit Diagnosis: Low back pain, unspecified back pain laterality, unspecified chronicity, unspecified whether sciatica present  Abnormal posture  Muscle weakness (generalized)     Problem List There are no problems to display for this patient.  Myles Gip PT, DPT [phone]  09/22/2020, 6:33 PM  [place] [facility] [address] [place], [place], [address] Phone: [phone]   Fax:  [phone]  Name: Kody Vigil MRN: [id number] Date of Birth: [birth year]

## 2020-09-29 ENCOUNTER — Ambulatory Visit: Payer: Medicare HMO | Admitting: Physical Therapy

## 2020-10-06 ENCOUNTER — Other Ambulatory Visit: Payer: Self-pay

## 2020-10-06 ENCOUNTER — Encounter: Payer: Self-pay | Admitting: Physical Therapy

## 2020-10-06 ENCOUNTER — Ambulatory Visit: Payer: Medicare HMO | Attending: Neurology | Admitting: Physical Therapy

## 2020-10-06 DIAGNOSIS — R293 Abnormal posture: Secondary | ICD-10-CM | POA: Diagnosis not present

## 2020-10-06 DIAGNOSIS — M6281 Muscle weakness (generalized): Secondary | ICD-10-CM | POA: Diagnosis not present

## 2020-10-06 DIAGNOSIS — M545 Low back pain, unspecified: Secondary | ICD-10-CM | POA: Insufficient documentation

## 2020-10-06 NOTE — Therapy (Signed)
[facility] [address] [place], [place], [address] Phone: [phone]   Fax:  [phone]  Physical Therapy Treatment Physical Therapy Progress Note   Dates of reporting period  07/21/2020   to   10/06/2020   Patient Details  Name: Erica Schultz MRN: [id number] Date of Birth: [birth year] Referring Provider (PT): Jennings Books   Encounter Date: 10/06/2020   PT End of Session - 10/06/20 1334    Visit Number 10    Number of Visits 19    Date for PT Re-Evaluation 10/20/20    Authorization Type IE 07/21/20    PT Start Time 1330    PT Stop Time 1415    PT Time Calculation (min) 45 min    Activity Tolerance Patient tolerated treatment well    Behavior During Therapy [facility] for tasks assessed/performed           Past Medical History:  Diagnosis Date  . Hypertension     Past Surgical History:  Procedure Laterality Date  . BREAST CYST ASPIRATION Right 2001  . UMBILICAL HERNIA REPAIR  2017    There were no vitals filed for this visit.   Subjective Assessment - 10/06/20 1334    Subjective Patient notes that everything is feeling "pretty okay" and that the pain is still there but it is not as it was before. Patient continues to work on exercises with good effect.    Currently in Pain? Yes    Pain Score 3     Pain Location Back    Pain Orientation Lower           TREATMENT Manual Therapy: STM and TPR performed to L gluteal complex to allow for decreased tension and pain and improved posture and function L sacral border mobilizations for decreased pain and improved function  Neuromuscular Re-education: Supine diaphragmatic breathing with VCs and TCs for downregulation of the nervous system and improved management of IAP Supine pelvic tilts with coordinated breath for improved lumbar mobility and decreased pain Supine hooklying trunk rotations with coordinated breath for improved lumbar mobility and decreased pain Supine  single knee to chest with diaphragmatic breath for improved lumbar mobility and decreased pain, BLE Supine double knee to chest with diaphragmatic breath for improved lumbar mobility and decreased pain Supine figure 4 stretch for improved lumbar mobility and decreased pain   Treatments unbilled: MHP to lumbar spine during neuromuscular re-education IFC, lumbar region, 23.17m, 90 degree sweep, 80-100 bps for pain relief. No skin irritation or signs of burn noted after removal of pads. 12 minutes during neuromuscular re-education.  Patient educated throughout session on appropriate technique and form using multi-modal cueing, HEP, and activity modification. Patient articulated understanding and returned demonstration.  Patient Response to interventions: 0/10 pain  ASSESSMENT Patient presents to clinic with excellent motivation to participate in therapy. Patient demonstrates deficits in pain free spinal ROM, posture, gait, hamstring extensibility, and L hip IR strength. Patient with much improved pain and perceived mobility s/p manual interventions during today's session and responded positively to all interventions with good pain modulation with IFC protocol. Patient's condition has the potential to improve in response to therapy. Maximum improvement is yet to be obtained. The anticipated improvement is attainable and reasonable in a generally predictable time. Patient will benefit from continued skilled therapeutic intervention to address remaining deficits in pain free spinal ROM, posture, gait, hamstring extensibility, and L hip IR strength in order to increase function and improve overall QOL.  PT Long Term Goals - 10/06/20 1337      PT LONG TERM GOAL #1   Title Patient will be independent with HEP in order to improve strength and decrease back pain in order to improve pain-free function at home and work.    Baseline IE: not initiated; 4/12: IND    Time 6    Period Weeks    Status  Achieved      PT LONG TERM GOAL #2   Title Patient will decrease worst back pain (as reported over a given 7 day period) on NPRS by at least 2 points in order to demonstrate clinically significant reduction in back pain.    Baseline IE: 10/10; 4/12: 9/10; 5/10: 5-6/10    Time 6    Period Weeks    Status On-going    Target Date 10/20/20      PT LONG TERM GOAL #3   Title Patient will report "a little bit of difficulty" or "no difficulty" with going up/down 2 flights of stairs in order to safely participate in activities at home and in the community.    Baseline IE: "quite a bit of difficulty"; 4/12: not assessed; 5/10: after work "quite a bit of difficulty", on days off or before work "no difficulty"    Time 6    Period Weeks    Status Partially Met    Target Date 10/20/20      PT LONG TERM GOAL #4   Title Patient will demonstrate improved function as evidenced by a score of 50 on FOTO measure for full participation in activities at home and in the community.    Baseline IE: 44; 4/12: not assessed; 5/10: 56    Time 6    Period Weeks    Status Achieved    Target Date 10/20/20      PT LONG TERM GOAL #5   Title Patient will demonstrate understanding of basic self-management/down-regulation of the nervous system for persistent pain condition and stress as evidenced by diaphragmatic breathing without cueing, body scan/progressive relaxation meditation, and improved sleep hygiene in order to transition to independent management of patient's chief complaint: persistent lumbar pain.    Baseline IE: not demonstrated; 4/12: practicing body scan and diaphragmatic breathing at home; 5/10: practicing body scan, diaphragmatic breathing, boundary setting    Time 6    Period Weeks    Status Achieved    Target Date 10/20/20                 Plan - 10/06/20 1337    Clinical Impression Statement Patient presents to clinic with excellent motivation to participate in therapy. Patient demonstrates  deficits in pain free spinal ROM, posture, gait, hamstring extensibility, and L hip IR strength. Patient with much improved pain and perceived mobility s/p manual interventions during today's session and responded positively to all interventions with good pain modulation with IFC protocol. Patient's condition has the potential to improve in response to therapy. Maximum improvement is yet to be obtained. The anticipated improvement is attainable and reasonable in a generally predictable time. Patient will benefit from continued skilled therapeutic intervention to address remaining deficits in pain free spinal ROM, posture, gait, hamstring extensibility, and L hip IR strength in order to increase function and improve overall QOL.    Personal Factors and Comorbidities Age;Past/Current Experience;Time since onset of injury/illness/exacerbation;Comorbidity 3+;Profession    Comorbidities HTN, OSA, DM, hyperlipidemia, migraine    Examination-Activity Limitations Squat;Lift;Bend;Stand;Locomotion Level;Carry;Sleep;Sit;Reach Overhead;Stairs    Transport planner;Occupation;Driving  Stability/Clinical Decision Making Evolving/Moderate complexity    Rehab Potential Fair    PT Frequency 2x / week    PT Duration 6 weeks    PT Treatment/Interventions Moist Heat;Cryotherapy;Therapeutic activities;Neuromuscular re-education;Therapeutic exercise;Patient/family education;Balance training;Functional mobility training;Stair training;Gait training;Manual techniques;Taping;Vestibular;Orthotic Fit/Training;ADLs/Self Care Home Management    PT Home Exercise Plan LBP gentle series with TrA basics    Consulted and Agree with Plan of Care Patient           Patient will benefit from skilled therapeutic intervention in order to improve the following deficits and impairments:  Abnormal gait,Decreased activity tolerance,Decreased coordination,Decreased strength,Postural  dysfunction,Improper body mechanics,Pain,Difficulty walking,Decreased balance,Decreased endurance  Visit Diagnosis: Low back pain, unspecified back pain laterality, unspecified chronicity, unspecified whether sciatica present  Abnormal posture  Muscle weakness (generalized)     Problem List There are no problems to display for this patient.  Myles Gip PT, DPT [phone]  10/06/2020, 4:09 PM  [facility] [address] [place], [place], [address] Phone: [phone]   Fax:  [phone]  Name: Dacie Mandel MRN: [id number] Date of Birth: 08/06/1950

## 2020-10-20 ENCOUNTER — Ambulatory Visit: Payer: Medicare HMO | Admitting: Physical Therapy

## 2020-10-20 ENCOUNTER — Other Ambulatory Visit: Payer: Self-pay

## 2020-10-20 ENCOUNTER — Encounter: Payer: Self-pay | Admitting: Physical Therapy

## 2020-10-20 DIAGNOSIS — M6281 Muscle weakness (generalized): Secondary | ICD-10-CM | POA: Diagnosis not present

## 2020-10-20 DIAGNOSIS — R293 Abnormal posture: Secondary | ICD-10-CM | POA: Diagnosis not present

## 2020-10-20 DIAGNOSIS — M545 Low back pain, unspecified: Secondary | ICD-10-CM

## 2020-10-20 NOTE — Therapy (Signed)
[facility] [address] [place], [place], [address] Phone: [phone]   Fax:  [phone]  Physical Therapy Treatment  Patient Details  Name: Erica Schultz MRN: [id number] Date of Birth: [birth year] Referring Provider (PT): Jennings Books   Encounter Date: 10/20/2020   PT End of Session - 10/20/20 1444    Visit Number 11    Number of Visits 27    Date for PT Re-Evaluation 12/15/20    Authorization Type IE 07/21/20    PT Start Time 1330    PT Stop Time 1410    PT Time Calculation (min) 40 min    Activity Tolerance Patient tolerated treatment well    Behavior During Therapy [facility] for tasks assessed/performed           Past Medical History:  Diagnosis Date  . Hypertension     Past Surgical History:  Procedure Laterality Date  . BREAST CYST ASPIRATION Right 2001  . UMBILICAL HERNIA REPAIR  2017    There were no vitals filed for this visit.   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 Patient notes that being bent over the sink has aggrvated her back more than her usual work duties.    Currently in Pain? Yes    Pain Score 5     Pain Location Back           TREATMENT Manual Therapy: STM and TPR performed to L gluteal complex to allow for decreased tension and pain and improved posture and function L sacral border mobilizations for decreased pain and improved function  Neuromuscular Re-education: Supine diaphragmatic breathing with VCs and TCs for downregulation of the nervous system and improved management of IAP Supine pelvic tilts with coordinated breath for improved lumbar mobility and decreased pain Supine hooklying trunk rotations with coordinated breath for improved lumbar mobility and decreased pain Supine single knee to chest  with diaphragmatic breath for improved lumbar mobility and decreased pain, BLE Supine double knee to chest with diaphragmatic breath for improved lumbar mobility and decreased pain Supine figure 4 stretch for improved lumbar mobility and decreased pain   Treatments unbilled: MHP to lumbar spine during neuromuscular re-education IFC, lumbar region, 23.29mV, 90 degree sweep, 80-100 bps for pain relief. No skin irritation or signs of burn noted after removal of pads. 12 minutes during neuromuscular re-education.  Patient educated throughout session on appropriate technique and form using multi-modal cueing, HEP, and activity modification. Patient articulated understanding and returned demonstration.  Patient Response to interventions: 0/10 pain  ASSESSMENT Patient presents to clinic with excellent motivation to participate in therapy. Patient demonstrates deficits in pain free spinal ROM, posture, gait, hamstring extensibility, and L hip IR strength. Patient continues to have significant reduction in pain with stretching and manual during today's session and responded positively to all interventions with good pain modulation with IFC protocol. At this time, patient is appropriate to begin gradual transition to self-management practices, and will benefit from continued skilled therapeutic intervention to address remaining deficits in pain free spinal ROM, posture, gait, hamstring extensibility, and L hip IR strength in order to increase function and improve overall QOL.    PT Long Term Goals - 10/20/20 1445      PT LONG TERM GOAL #1   Title Patient will be independent with HEP  in order to improve strength and decrease back pain in order to improve pain-free function at home and work.    Baseline IE: not initiated; 4/12: IND    Time 6    Period Weeks    Status Achieved      PT LONG TERM GOAL #2   Title Patient will decrease worst back pain (as reported over a given 7 day period) on NPRS by at  least 2 points in order to demonstrate clinically significant reduction in back pain.    Baseline IE: 10/10; 4/12: 9/10; 5/10: 5-6/10    Time 8    Period Weeks    Status On-going    Target Date 12/15/20      PT LONG TERM GOAL #3   Title Patient will report "a little bit of difficulty" or "no difficulty" with going up/down 2 flights of stairs in order to safely participate in activities at home and in the community.    Baseline IE: "quite a bit of difficulty"; 4/12: not assessed; 5/10: after work "quite a bit of difficulty", on days off or before work "no difficulty"    Time 8    Period Weeks    Status Partially Met    Target Date 12/15/20      PT LONG TERM GOAL #4   Title Patient will demonstrate improved function as evidenced by a score of 50 on FOTO measure for full participation in activities at home and in the community.    Baseline IE: 44; 4/12: not assessed; 5/10: 56    Time 6    Period Weeks    Status Achieved      PT LONG TERM GOAL #5   Title Patient will demonstrate understanding of basic self-management/down-regulation of the nervous system for persistent pain condition and stress as evidenced by diaphragmatic breathing without cueing, body scan/progressive relaxation meditation, and improved sleep hygiene in order to transition to independent management of patient's chief complaint: persistent lumbar pain.    Baseline IE: not demonstrated; 4/12: practicing body scan and diaphragmatic breathing at home; 5/10: practicing body scan, diaphragmatic breathing, boundary setting    Time 6    Period Weeks    Status Achieved                 Plan - 10/20/20 1444    Clinical Impression Statement Patient presents to clinic with excellent motivation to participate in therapy. Patient demonstrates deficits in pain free spinal ROM, posture, gait, hamstring extensibility, and L hip IR strength. Patient continues to have significant reduction in pain with stretching and manual during  today's session and responded positively to all interventions with good pain modulation with IFC protocol. At this time, patient is appropriate to begin gradual transition to self-management practices, and will benefit from continued skilled therapeutic intervention to address remaining deficits in pain free spinal ROM, posture, gait, hamstring extensibility, and L hip IR strength in order to increase function and improve overall QOL.    Personal Factors and Comorbidities Age;Past/Current Experience;Time since onset of injury/illness/exacerbation;Comorbidity 3+;Profession    Comorbidities HTN, OSA, DM, hyperlipidemia, migraine    Examination-Activity Limitations Squat;Lift;Bend;Stand;Locomotion Level;Carry;Sleep;Sit;Reach Overhead;Stairs    Transport planner;Occupation;Driving    Stability/Clinical Decision Making Evolving/Moderate complexity    Rehab Potential Fair    PT Frequency 1x / week    PT Duration 8 weeks    PT Treatment/Interventions Moist Heat;Cryotherapy;Therapeutic activities;Neuromuscular re-education;Therapeutic exercise;Patient/family education;Balance training;Functional mobility training;Stair training;Gait training;Manual techniques;Taping;Vestibular;Orthotic Fit/Training;ADLs/Self Care Home Management    PT  Home Exercise Plan LBP gentle series with TrA basics    Consulted and Agree with Plan of Care Patient           Patient will benefit from skilled therapeutic intervention in order to improve the following deficits and impairments:  Abnormal gait,Decreased activity tolerance,Decreased coordination,Decreased strength,Postural dysfunction,Improper body mechanics,Pain,Difficulty walking,Decreased balance,Decreased endurance  Visit Diagnosis: Low back pain, unspecified back pain laterality, unspecified chronicity, unspecified whether sciatica present - Plan: PT plan of care cert/re-cert  Abnormal posture - Plan: PT plan  of care cert/re-cert  Muscle weakness (generalized) - Plan: PT plan of care cert/re-cert     Problem List There are no problems to display for this patient.  Myles Gip PT, DPT [phone]  10/20/2020, 2:54 PM  [facility] [address] [place], [place], [address] Phone: [phone]   Fax:  [phone]  Name: Erica Schultz MRN: [id number] Date of Birth: August 11, 1950

## 2020-10-21 DIAGNOSIS — K219 Gastro-esophageal reflux disease without esophagitis: Secondary | ICD-10-CM | POA: Diagnosis not present

## 2020-10-27 ENCOUNTER — Ambulatory Visit: Payer: Medicare HMO | Admitting: Physical Therapy

## 2020-10-27 ENCOUNTER — Other Ambulatory Visit: Payer: Self-pay

## 2020-10-27 ENCOUNTER — Encounter: Payer: Self-pay | Admitting: Physical Therapy

## 2020-10-27 DIAGNOSIS — R293 Abnormal posture: Secondary | ICD-10-CM | POA: Diagnosis not present

## 2020-10-27 DIAGNOSIS — M545 Low back pain, unspecified: Secondary | ICD-10-CM

## 2020-10-27 DIAGNOSIS — M6281 Muscle weakness (generalized): Secondary | ICD-10-CM

## 2020-10-27 NOTE — Therapy (Signed)
[facility] [address] [place], [place], [address] Phone: [phone]   Fax:  [phone]  Physical Therapy Treatment  Patient Details  Name: Erica Schultz MRN: [id number] Date of Birth: [birth year] Referring Provider (PT): Jennings Books   Encounter Date: 10/27/2020   PT End of Session - 10/27/20 1322    Visit Number 12    Number of Visits 27    Date for PT Re-Evaluation 12/15/20    Authorization Type IE 07/21/20    PT Start Time 1330    PT Stop Time 1410    PT Time Calculation (min) 40 min    Activity Tolerance Patient tolerated treatment well    Behavior During Therapy [facility] for tasks assessed/performed           Past Medical History:  Diagnosis Date  . Hypertension     Past Surgical History:  Procedure Laterality Date  . BREAST CYST ASPIRATION Right 2001  . UMBILICAL HERNIA REPAIR  2017    There were no vitals filed for this visit.   Subjective Assessment - 10/27/20 1329    Subjective Patient states that she has been working alone which has put more demand on her body. Patient is very exhausted today and has more ocmplaints than just her back all related to overuse at work.    Currently in Pain? Yes    Pain Score 5     Pain Location Back    Pain Orientation Lower           TREATMENT Manual Therapy: STM and TPR performed to L gluteal complex to allow for decreased tension and pain and improved posture and function L sacral border mobilizations for decreased pain and improved function  Neuromuscular Re-education: Supine diaphragmatic breathing with VCs and TCs for downregulation of the nervous system and improved management of IAP Supine pelvic tilts with coordinated breath for improved lumbar mobility and decreased pain Supine TrA with march with coordinated breath for improved postural control Supine hooklying trunk rotations with coordinated breath for improved lumbar mobility and decreased pain Supine  single knee to chest with diaphragmatic breath for improved lumbar mobility and decreased pain, BLE Supine double knee to chest with diaphragmatic breath for improved lumbar mobility and decreased pain Supine figure 4 stretch for improved lumbar mobility and decreased pain   Treatments unbilled: MHP to lumbar spine during neuromuscular re-education IFC, lumbar region, 19.5 mV, 90 degree sweep, 80-100 bps for pain relief. No skin irritation or signs of burn noted after removal of pads. 12 minutes during neuromuscular re-education.  Patient educated throughout session on appropriate technique and form using multi-modal cueing, HEP, and activity modification. Patient articulated understanding and returned demonstration.  Patient Response to interventions: 0/10 pain  ASSESSMENT Patient presents to clinic with excellent motivation to participate in therapy. Patient demonstrates deficits in pain free spinal ROM, posture, gait, hamstring extensibility, and L hip IR strength. Patient with improved tissue extensibility at L SIJ and surrounding features with manual during today's session and responded positively to all interventions. Patient will benefit from continued skilled therapeutic intervention to address remaining deficits in pain free spinal ROM, posture, gait, hamstring extensibility, and L hip IR strength in order to increase function and improve overall QOL.     PT Long Term Goals - 10/20/20 1445      PT LONG TERM GOAL #1   Title Patient will be independent with HEP in order to improve strength and decrease back pain in order to  improve pain-free function at home and work.    Baseline IE: not initiated; 4/12: IND    Time 6    Period Weeks    Status Achieved      PT LONG TERM GOAL #2   Title Patient will decrease worst back pain (as reported over a given 7 day period) on NPRS by at least 2 points in order to demonstrate clinically significant reduction in back pain.    Baseline IE:  10/10; 4/12: 9/10; 5/10: 5-6/10    Time 8    Period Weeks    Status On-going    Target Date 12/15/20      PT LONG TERM GOAL #3   Title Patient will report "a little bit of difficulty" or "no difficulty" with going up/down 2 flights of stairs in order to safely participate in activities at home and in the community.    Baseline IE: "quite a bit of difficulty"; 4/12: not assessed; 5/10: after work "quite a bit of difficulty", on days off or before work "no difficulty"    Time 8    Period Weeks    Status Partially Met    Target Date 12/15/20      PT LONG TERM GOAL #4   Title Patient will demonstrate improved function as evidenced by a score of 50 on FOTO measure for full participation in activities at home and in the community.    Baseline IE: 44; 4/12: not assessed; 5/10: 56    Time 6    Period Weeks    Status Achieved      PT LONG TERM GOAL #5   Title Patient will demonstrate understanding of basic self-management/down-regulation of the nervous system for persistent pain condition and stress as evidenced by diaphragmatic breathing without cueing, body scan/progressive relaxation meditation, and improved sleep hygiene in order to transition to independent management of patient's chief complaint: persistent lumbar pain.    Baseline IE: not demonstrated; 4/12: practicing body scan and diaphragmatic breathing at home; 5/10: practicing body scan, diaphragmatic breathing, boundary setting    Time 6    Period Weeks    Status Achieved                 Plan - 10/27/20 1322    Clinical Impression Statement Patient presents to clinic with excellent motivation to participate in therapy. Patient demonstrates deficits in pain free spinal ROM, posture, gait, hamstring extensibility, and L hip IR strength. Patient with improved tissue extensibility at L SIJ and surrounding features with manual during today's session and responded positively to all interventions. Patient will benefit from  continued skilled therapeutic intervention to address remaining deficits in pain free spinal ROM, posture, gait, hamstring extensibility, and L hip IR strength in order to increase function and improve overall QOL.    Personal Factors and Comorbidities Age;Past/Current Experience;Time since onset of injury/illness/exacerbation;Comorbidity 3+;Profession    Comorbidities HTN, OSA, DM, hyperlipidemia, migraine    Examination-Activity Limitations Squat;Lift;Bend;Stand;Locomotion Level;Carry;Sleep;Sit;Reach Overhead;Stairs    Transport planner;Occupation;Driving    Stability/Clinical Decision Making Evolving/Moderate complexity    Rehab Potential Fair    PT Frequency 1x / week    PT Duration 8 weeks    PT Treatment/Interventions Moist Heat;Cryotherapy;Therapeutic activities;Neuromuscular re-education;Therapeutic exercise;Patient/family education;Balance training;Functional mobility training;Stair training;Gait training;Manual techniques;Taping;Vestibular;Orthotic Fit/Training;ADLs/Self Care Home Management    PT Home Exercise Plan LBP gentle series with TrA basics    Consulted and Agree with Plan of Care Patient           Patient  will benefit from skilled therapeutic intervention in order to improve the following deficits and impairments:  Abnormal gait,Decreased activity tolerance,Decreased coordination,Decreased strength,Postural dysfunction,Improper body mechanics,Pain,Difficulty walking,Decreased balance,Decreased endurance  Visit Diagnosis: Low back pain, unspecified back pain laterality, unspecified chronicity, unspecified whether sciatica present  Abnormal posture  Muscle weakness (generalized)     Problem List There are no problems to display for this patient.  Myles Gip PT, DPT [phone]  10/27/2020, 2:49 PM  [place] [address] [place], [place], [address] Phone:  [phone]   Fax:  [phone]  Name: Erica Schultz MRN: [id number] Date of Birth: 1950/12/07

## 2020-10-29 DIAGNOSIS — H40003 Preglaucoma, unspecified, bilateral: Secondary | ICD-10-CM | POA: Diagnosis not present

## 2020-11-03 ENCOUNTER — Encounter: Payer: Self-pay | Admitting: Physical Therapy

## 2020-11-03 ENCOUNTER — Ambulatory Visit: Payer: Medicare HMO | Attending: Neurology | Admitting: Physical Therapy

## 2020-11-03 ENCOUNTER — Other Ambulatory Visit: Payer: Self-pay

## 2020-11-03 DIAGNOSIS — R293 Abnormal posture: Secondary | ICD-10-CM | POA: Diagnosis not present

## 2020-11-03 DIAGNOSIS — M6281 Muscle weakness (generalized): Secondary | ICD-10-CM | POA: Insufficient documentation

## 2020-11-03 DIAGNOSIS — M545 Low back pain, unspecified: Secondary | ICD-10-CM | POA: Insufficient documentation

## 2020-11-03 NOTE — Therapy (Signed)
[facility] [facility] [address] [place], [place], [address] Phone: [phone]   Fax:  [phone]  Physical Therapy Treatment  Patient Details  Name: Erica Schultz MRN: [id number] Date of Birth: [birth year] Referring Provider (PT): Jennings Books   Encounter Date: 11/03/2020   PT End of Session - 11/03/20 1343    Visit Number 13    Number of Visits 27    Date for PT Re-Evaluation 12/15/20    Authorization Type IE 07/21/20    PT Start Time 1330    PT Stop Time 1410    PT Time Calculation (min) 40 min    Activity Tolerance Patient tolerated treatment well    Behavior During Therapy [facility] for tasks assessed/performed           Past Medical History:  Diagnosis Date  . Hypertension     Past Surgical History:  Procedure Laterality Date  . BREAST CYST ASPIRATION Right 2001  . UMBILICAL HERNIA REPAIR  2017    There were no vitals filed for this visit.   Subjective Assessment - 11/03/20 1344    Subjective Patient presents to clinic exhausted and in pain. Patient notes she has been up since 130A and then worked in [employer]. She notes her back is well controlled, but her whole body is tired and aching.    Currently in Pain? Yes    Pain Score 5     Pain Location Back    Pain Orientation Lower           TREATMENT Manual Therapy: STM and TPR performed to L gluteal complex to allow for decreased tension and pain and improved posture and function L sacral border mobilizations for decreased pain and improved function  Neuromuscular Re-education: Supine diaphragmatic breathing with VCs and TCs for downregulation of the nervous system and improved management of IAP Supine pelvic tilts with coordinated breath for improved lumbar mobility and decreased pain Supine TrA with march with coordinated breath for improved postural control Supine hooklying trunk rotations with coordinated breath for improved lumbar mobility and decreased  pain Supine single knee to chest with diaphragmatic breath for improved lumbar mobility and decreased pain, BLE Supine double knee to chest with diaphragmatic breath for improved lumbar mobility and decreased pain Supine figure 4 stretch for improved lumbar mobility and decreased pain   Treatments unbilled: MHP to lumbar spine during neuromuscular re-education IFC, lumbar region, 19.5 mV, 90 degree sweep, 80-100 bps for pain relief. No skin irritation or signs of burn noted after removal of pads. 12 minutes during neuromuscular re-education.  Patient educated throughout session on appropriate technique and form using multi-modal cueing, HEP, and activity modification. Patient articulated understanding and returned demonstration.  Patient Response to interventions: 0/10 pain  ASSESSMENT Patient presents to clinic with excellent motivation to participate in therapy. Patient demonstrates deficits in pain free spinal ROM, posture, gait, hamstring extensibility, and L hip IR strength. Patient presenting with symptoms of burnout and exhaustion during today's session and responded positively to manual and pain modulating interventions. Patient will benefit from continued skilled therapeutic intervention to address remaining deficits in pain free spinal ROM, posture, gait, hamstring extensibility, and L hip IR strength in order to increase function and improve overall QOL.     PT Long Term Goals - 10/20/20 1445      PT LONG TERM GOAL #1   Title Patient will be independent with HEP in order to improve strength and decrease back pain in order to  improve pain-free function at home and work.    Baseline IE: not initiated; 4/12: IND    Time 6    Period Weeks    Status Achieved      PT LONG TERM GOAL #2   Title Patient will decrease worst back pain (as reported over a given 7 day period) on NPRS by at least 2 points in order to demonstrate clinically significant reduction in back pain.    Baseline  IE: 10/10; 4/12: 9/10; 5/10: 5-6/10    Time 8    Period Weeks    Status On-going    Target Date 12/15/20      PT LONG TERM GOAL #3   Title Patient will report "a little bit of difficulty" or "no difficulty" with going up/down 2 flights of stairs in order to safely participate in activities at home and in the community.    Baseline IE: "quite a bit of difficulty"; 4/12: not assessed; 5/10: after work "quite a bit of difficulty", on days off or before work "no difficulty"    Time 8    Period Weeks    Status Partially Met    Target Date 12/15/20      PT LONG TERM GOAL #4   Title Patient will demonstrate improved function as evidenced by a score of 50 on FOTO measure for full participation in activities at home and in the community.    Baseline IE: 44; 4/12: not assessed; 5/10: 56    Time 6    Period Weeks    Status Achieved      PT LONG TERM GOAL #5   Title Patient will demonstrate understanding of basic self-management/down-regulation of the nervous system for persistent pain condition and stress as evidenced by diaphragmatic breathing without cueing, body scan/progressive relaxation meditation, and improved sleep hygiene in order to transition to independent management of patient's chief complaint: persistent lumbar pain.    Baseline IE: not demonstrated; 4/12: practicing body scan and diaphragmatic breathing at home; 5/10: practicing body scan, diaphragmatic breathing, boundary setting    Time 6    Period Weeks    Status Achieved                 Plan - 11/03/20 1343    Clinical Impression Statement Patient presents to clinic with excellent motivation to participate in therapy. Patient demonstrates deficits in pain free spinal ROM, posture, gait, hamstring extensibility, and L hip IR strength. Patient presenting with symptoms of burnout and exhaustion during today's session and responded positively to manual and pain modulating interventions. Patient will benefit from continued  skilled therapeutic intervention to address remaining deficits in pain free spinal ROM, posture, gait, hamstring extensibility, and L hip IR strength in order to increase function and improve overall QOL.    Personal Factors and Comorbidities Age;Past/Current Experience;Time since onset of injury/illness/exacerbation;Comorbidity 3+;Profession    Comorbidities HTN, OSA, DM, hyperlipidemia, migraine    Examination-Activity Limitations Squat;Lift;Bend;Stand;Locomotion Level;Carry;Sleep;Sit;Reach Overhead;Stairs    Transport planner;Occupation;Driving    Stability/Clinical Decision Making Evolving/Moderate complexity    Rehab Potential Fair    PT Frequency 1x / week    PT Duration 8 weeks    PT Treatment/Interventions Moist Heat;Cryotherapy;Therapeutic activities;Neuromuscular re-education;Therapeutic exercise;Patient/family education;Balance training;Functional mobility training;Stair training;Gait training;Manual techniques;Taping;Vestibular;Orthotic Fit/Training;ADLs/Self Care Home Management    PT Home Exercise Plan LBP gentle series with TrA basics    Consulted and Agree with Plan of Care Patient           Patient will benefit  from skilled therapeutic intervention in order to improve the following deficits and impairments:  Abnormal gait,Decreased activity tolerance,Decreased coordination,Decreased strength,Postural dysfunction,Improper body mechanics,Pain,Difficulty walking,Decreased balance,Decreased endurance  Visit Diagnosis: Low back pain, unspecified back pain laterality, unspecified chronicity, unspecified whether sciatica present  Abnormal posture  Muscle weakness (generalized)     Problem List There are no problems to display for this patient.  Myles Gip PT, DPT [phone]  11/03/2020, 2:27 PM  [facility] [address] [place], [place], [address] Phone: [phone]    Fax:  [phone]  Name: Erica Schultz MRN: [id number] Date of Birth: 03/26/1951

## 2020-11-05 DIAGNOSIS — E7800 Pure hypercholesterolemia, unspecified: Secondary | ICD-10-CM | POA: Diagnosis not present

## 2020-11-05 DIAGNOSIS — E1169 Type 2 diabetes mellitus with other specified complication: Secondary | ICD-10-CM | POA: Diagnosis not present

## 2020-11-05 DIAGNOSIS — E785 Hyperlipidemia, unspecified: Secondary | ICD-10-CM | POA: Diagnosis not present

## 2020-11-05 DIAGNOSIS — Z862 Personal history of diseases of the blood and blood-forming organs and certain disorders involving the immune mechanism: Secondary | ICD-10-CM | POA: Diagnosis not present

## 2020-11-10 ENCOUNTER — Encounter: Payer: Self-pay | Admitting: Physical Therapy

## 2020-11-10 ENCOUNTER — Ambulatory Visit: Payer: Medicare HMO | Admitting: Physical Therapy

## 2020-11-10 ENCOUNTER — Other Ambulatory Visit: Payer: Self-pay

## 2020-11-10 DIAGNOSIS — M545 Low back pain, unspecified: Secondary | ICD-10-CM

## 2020-11-10 DIAGNOSIS — R293 Abnormal posture: Secondary | ICD-10-CM

## 2020-11-10 DIAGNOSIS — M6281 Muscle weakness (generalized): Secondary | ICD-10-CM

## 2020-11-10 NOTE — Therapy (Signed)
[facility] [address] [place], [place], [address] Phone: [phone]   Fax:  [phone]  Physical Therapy Treatment  Patient Details  Name: Erica Schultz MRN: [id number] Date of Birth: [birth year] Referring Provider (PT): Jennings Books   Encounter Date: 11/10/2020   PT End of Session - 11/10/20 1335     Visit Number 14    Number of Visits 27    Date for PT Re-Evaluation 12/15/20    Authorization Type IE 07/21/20    PT Start Time 1330    PT Stop Time 1410    PT Time Calculation (min) 40 min    Activity Tolerance Patient tolerated treatment well    Behavior During Therapy [facility] for tasks assessed/performed             Past Medical History:  Diagnosis Date   Hypertension     Past Surgical History:  Procedure Laterality Date   BREAST CYST ASPIRATION Right [id number]   UMBILICAL HERNIA REPAIR  2017    There were no vitals filed for this visit.   Subjective Assessment - 11/10/20 1333     Subjective Patient notes that she is exahusted still but notes that her back pain doesn't stand out; her whole body hurts. Patient is eager to see family.    Currently in Pain? Yes    Pain Score 4     Pain Location Back    Pain Orientation Lower             TREATMENT Manual Therapy: STM and TPR performed to L gluteal complex to allow for decreased tension and pain and improved posture and function L sacral border mobilizations for decreased pain and improved function  Neuromuscular Re-education: Supine diaphragmatic breathing with VCs and TCs for downregulation of the nervous system and improved management of IAP Patient education on symptom management and the impact of stress on nervous system downregulation.  Treatments unbilled: MHP to lumbar spine during neuromuscular re-education IFC, lumbar region, 24 mV, 90 degree sweep, 80-100 bps for pain relief. No skin irritation or signs of burn noted after removal of pads. 12  minutes during neuromuscular re-education.  Patient educated throughout session on appropriate technique and form using multi-modal cueing, HEP, and activity modification. Patient articulated understanding and returned demonstration.  Patient Response to interventions: 0/10 pain  ASSESSMENT Patient presents to clinic with excellent motivation to participate in therapy. Patient demonstrates deficits in pain free spinal ROM, posture, gait, hamstring extensibility, and L hip IR strength. Patient continues to have significant symptoms of exhaustion which are negatively impacting her pain experience during today's session but patient  responded positively to manual and pain modulating interventions. Patient will benefit from continued skilled therapeutic intervention to address remaining deficits in pain free spinal ROM, posture, gait, hamstring extensibility, and L hip IR strength in order to increase function and improve overall QOL.    PT Long Term Goals - 10/20/20 1445       PT LONG TERM GOAL #1   Title Patient will be independent with HEP in order to improve strength and decrease back pain in order to improve pain-free function at home and work.    Baseline IE: not initiated; 4/12: IND    Time 6    Period Weeks    Status Achieved      PT LONG TERM GOAL #2   Title Patient will decrease worst back pain (as reported over a given 7 day period) on NPRS by at least  2 points in order to demonstrate clinically significant reduction in back pain.    Baseline IE: 10/10; 4/12: 9/10; 5/10: 5-6/10    Time 8    Period Weeks    Status On-going    Target Date 12/15/20      PT LONG TERM GOAL #3   Title Patient will report "a little bit of difficulty" or "no difficulty" with going up/down 2 flights of stairs in order to safely participate in activities at home and in the community.    Baseline IE: "quite a bit of difficulty"; 4/12: not assessed; 5/10: after work "quite a bit of difficulty", on days off  or before work "no difficulty"    Time 8    Period Weeks    Status Partially Met    Target Date 12/15/20      PT LONG TERM GOAL #4   Title Patient will demonstrate improved function as evidenced by a score of 50 on FOTO measure for full participation in activities at home and in the community.    Baseline IE: 44; 4/12: not assessed; 5/10: 56    Time 6    Period Weeks    Status Achieved      PT LONG TERM GOAL #5   Title Patient will demonstrate understanding of basic self-management/down-regulation of the nervous system for persistent pain condition and stress as evidenced by diaphragmatic breathing without cueing, body scan/progressive relaxation meditation, and improved sleep hygiene in order to transition to independent management of patient's chief complaint: persistent lumbar pain.    Baseline IE: not demonstrated; 4/12: practicing body scan and diaphragmatic breathing at home; 5/10: practicing body scan, diaphragmatic breathing, boundary setting    Time 6    Period Weeks    Status Achieved                   Plan - 11/10/20 1336     Clinical Impression Statement Patient presents to clinic with excellent motivation to participate in therapy. Patient demonstrates deficits in pain free spinal ROM, posture, gait, hamstring extensibility, and L hip IR strength. Patient continues to have significant symptoms of exhaustion which are negatively impacting her pain experience during today's session but patient  responded positively to manual and pain modulating interventions. Patient will benefit from continued skilled therapeutic intervention to address remaining deficits in pain free spinal ROM, posture, gait, hamstring extensibility, and L hip IR strength in order to increase function and improve overall QOL.    Personal Factors and Comorbidities Age;Past/Current Experience;Time since onset of injury/illness/exacerbation;Comorbidity 3+;Profession    Comorbidities HTN, OSA, DM,  hyperlipidemia, migraine    Examination-Activity Limitations Squat;Lift;Bend;Stand;Locomotion Level;Carry;Sleep;Sit;Reach Overhead;Stairs    Transport planner;Occupation;Driving    Stability/Clinical Decision Making Evolving/Moderate complexity    Rehab Potential Fair    PT Frequency 1x / week    PT Duration 8 weeks    PT Treatment/Interventions Moist Heat;Cryotherapy;Therapeutic activities;Neuromuscular re-education;Therapeutic exercise;Patient/family education;Balance training;Functional mobility training;Stair training;Gait training;Manual techniques;Taping;Vestibular;Orthotic Fit/Training;ADLs/Self Care Home Management    PT Home Exercise Plan LBP gentle series with TrA basics    Consulted and Agree with Plan of Care Patient             Patient will benefit from skilled therapeutic intervention in order to improve the following deficits and impairments:  Abnormal gait, Decreased activity tolerance, Decreased coordination, Decreased strength, Postural dysfunction, Improper body mechanics, Pain, Difficulty walking, Decreased balance, Decreased endurance  Visit Diagnosis: Low back pain, unspecified back pain laterality, unspecified chronicity, unspecified whether  sciatica present  Abnormal posture  Muscle weakness (generalized)     Problem List There are no problems to display for this patient.   Myles Gip PT, DPT [phone]  11/10/2020, 2:48 PM  [place] [facility] [address] [place], [place], [address] Phone: [phone]   Fax:  [phone]  Name: Erica Schultz MRN: [id number] Date of Birth: [birth year]

## 2020-11-12 DIAGNOSIS — Z862 Personal history of diseases of the blood and blood-forming organs and certain disorders involving the immune mechanism: Secondary | ICD-10-CM | POA: Diagnosis not present

## 2020-11-12 DIAGNOSIS — D72819 Decreased white blood cell count, unspecified: Secondary | ICD-10-CM | POA: Diagnosis not present

## 2020-11-12 DIAGNOSIS — E1169 Type 2 diabetes mellitus with other specified complication: Secondary | ICD-10-CM | POA: Diagnosis not present

## 2020-11-12 DIAGNOSIS — I10 Essential (primary) hypertension: Secondary | ICD-10-CM | POA: Diagnosis not present

## 2020-11-12 DIAGNOSIS — Z0000 Encounter for general adult medical examination without abnormal findings: Secondary | ICD-10-CM | POA: Diagnosis not present

## 2020-11-12 DIAGNOSIS — E7800 Pure hypercholesterolemia, unspecified: Secondary | ICD-10-CM | POA: Diagnosis not present

## 2020-11-17 ENCOUNTER — Ambulatory Visit: Payer: Medicare HMO | Admitting: Physical Therapy

## 2020-11-17 ENCOUNTER — Encounter: Payer: Self-pay | Admitting: Physical Therapy

## 2020-11-17 ENCOUNTER — Other Ambulatory Visit: Payer: Self-pay

## 2020-11-17 DIAGNOSIS — M6281 Muscle weakness (generalized): Secondary | ICD-10-CM

## 2020-11-17 DIAGNOSIS — R293 Abnormal posture: Secondary | ICD-10-CM

## 2020-11-17 DIAGNOSIS — M545 Low back pain, unspecified: Secondary | ICD-10-CM | POA: Diagnosis not present

## 2020-11-17 NOTE — Therapy (Signed)
[facility] [address] [place], [place], [address] Phone: [phone]   Fax:  [phone]  Physical Therapy Treatment  Patient Details  Name: Erica Schultz MRN: [id number] Date of Birth: Oct 13, 1950 Referring Provider (PT): Jennings Books   Encounter Date: 11/17/2020   PT End of Session - 11/17/20 1603     Visit Number 15    Number of Visits 27    Date for PT Re-Evaluation 12/15/20    Authorization Type IE 07/21/20    PT Start Time 1330    PT Stop Time 1410    PT Time Calculation (min) 40 min    Activity Tolerance Patient tolerated treatment well    Behavior During Therapy [facility] for tasks assessed/performed             Past Medical History:  Diagnosis Date   Hypertension     Past Surgical History:  Procedure Laterality Date   BREAST CYST ASPIRATION Right 0263   UMBILICAL HERNIA REPAIR  2017    There were no vitals filed for this visit.   Subjective Assessment - 11/17/20 1602     Subjective Patient reports that she almost cancelled her appointment because of her high level of fatigue from working long hours and not getting good rest at night.    Currently in Pain? Yes    Pain Score 4               TREATMENT  Neuromuscular Re-education: Supine diaphragmatic breathing with VCs and TCs for downregulation of the nervous system and improved management of IAP Supine pelvic tilts with coordinated breath for improved lumbar mobility and decreased pain Supine TrA with march with coordinated breath for improved postural control Supine hooklying trunk rotations with coordinated breath for improved lumbar mobility and decreased pain Supine single knee to chest with diaphragmatic breath for improved lumbar mobility and decreased pain, BLE Supine double knee to chest with diaphragmatic breath for improved lumbar mobility and decreased pain Supine figure 4 stretch for improved lumbar mobility and decreased  pain   Treatments unbilled: MHP to lumbar spine during neuromuscular re-education IFC, lumbar region, 19.5 mV, 90 degree sweep, 80-100 bps for pain relief. No skin irritation or signs of burn noted after removal of pads. 20 minutes during neuromuscular re-education.  Patient educated throughout session on appropriate technique and form using multi-modal cueing, HEP, and activity modification. Patient articulated understanding and returned demonstration.  Patient Response to interventions: 0/10 pain  ASSESSMENT Patient presents to clinic with excellent motivation to participate in therapy. Patient demonstrates deficits in pain free spinal ROM, posture, gait, hamstring extensibility, and L hip IR strength. Patient continues to have positive intrasession pain response to therapy during today's session and responded positively to pain modulating interventions. Patient will benefit from continued skilled therapeutic intervention to address remaining deficits in pain free spinal ROM, posture, gait, hamstring extensibility, and L hip IR strength in order to increase function and improve overall QOL.     PT Long Term Goals - 10/20/20 1445       PT LONG TERM GOAL #1   Title Patient will be independent with HEP in order to improve strength and decrease back pain in order to improve pain-free function at home and work.    Baseline IE: not initiated; 4/12: IND    Time 6    Period Weeks    Status Achieved      PT LONG TERM GOAL #2   Title Patient will decrease  worst back pain (as reported over a given 7 day period) on NPRS by at least 2 points in order to demonstrate clinically significant reduction in back pain.    Baseline IE: 10/10; 4/12: 9/10; 5/10: 5-6/10    Time 8    Period Weeks    Status On-going    Target Date 12/15/20      PT LONG TERM GOAL #3   Title Patient will report "a little bit of difficulty" or "no difficulty" with going up/down 2 flights of stairs in order to safely  participate in activities at home and in the community.    Baseline IE: "quite a bit of difficulty"; 4/12: not assessed; 5/10: after work "quite a bit of difficulty", on days off or before work "no difficulty"    Time 8    Period Weeks    Status Partially Met    Target Date 12/15/20      PT LONG TERM GOAL #4   Title Patient will demonstrate improved function as evidenced by a score of 50 on FOTO measure for full participation in activities at home and in the community.    Baseline IE: 44; 4/12: not assessed; 5/10: 56    Time 6    Period Weeks    Status Achieved      PT LONG TERM GOAL #5   Title Patient will demonstrate understanding of basic self-management/down-regulation of the nervous system for persistent pain condition and stress as evidenced by diaphragmatic breathing without cueing, body scan/progressive relaxation meditation, and improved sleep hygiene in order to transition to independent management of patient's chief complaint: persistent lumbar pain.    Baseline IE: not demonstrated; 4/12: practicing body scan and diaphragmatic breathing at home; 5/10: practicing body scan, diaphragmatic breathing, boundary setting    Time 6    Period Weeks    Status Achieved                   Plan - 11/17/20 1606     Clinical Impression Statement Patient presents to clinic with excellent motivation to participate in therapy. Patient demonstrates deficits in pain free spinal ROM, posture, gait, hamstring extensibility, and L hip IR strength. Patient continues to have positive intrasession pain response to therapy during today's session and responded positively to pain modulating interventions. Patient will benefit from continued skilled therapeutic intervention to address remaining deficits in pain free spinal ROM, posture, gait, hamstring extensibility, and L hip IR strength in order to increase function and improve overall QOL.    Personal Factors and Comorbidities Age;Past/Current  Experience;Time since onset of injury/illness/exacerbation;Comorbidity 3+;Profession    Comorbidities HTN, OSA, DM, hyperlipidemia, migraine    Examination-Activity Limitations Squat;Lift;Bend;Stand;Locomotion Level;Carry;Sleep;Sit;Reach Overhead;Stairs    Transport planner;Occupation;Driving    Stability/Clinical Decision Making Evolving/Moderate complexity    Rehab Potential Fair    PT Frequency 1x / week    PT Duration 8 weeks    PT Treatment/Interventions Moist Heat;Cryotherapy;Therapeutic activities;Neuromuscular re-education;Therapeutic exercise;Patient/family education;Balance training;Functional mobility training;Stair training;Gait training;Manual techniques;Taping;Vestibular;Orthotic Fit/Training;ADLs/Self Care Home Management    PT Home Exercise Plan LBP gentle series with TrA basics    Consulted and Agree with Plan of Care Patient             Patient will benefit from skilled therapeutic intervention in order to improve the following deficits and impairments:  Abnormal gait, Decreased activity tolerance, Decreased coordination, Decreased strength, Postural dysfunction, Improper body mechanics, Pain, Difficulty walking, Decreased balance, Decreased endurance  Visit Diagnosis: Low back pain, unspecified  back pain laterality, unspecified chronicity, unspecified whether sciatica present  Muscle weakness (generalized)  Abnormal posture     Problem List There are no problems to display for this patient.   Myles Gip PT, DPT [phone]  11/17/2020, 4:17 PM  [facility] [address] [place], [place], [address] Phone: [phone]   Fax:  [phone]  Name: Lutie Pickler MRN: [id number] Date of Birth: Aug 24, 1950

## 2020-12-02 DIAGNOSIS — R053 Chronic cough: Secondary | ICD-10-CM | POA: Diagnosis not present

## 2020-12-08 ENCOUNTER — Ambulatory Visit: Payer: Medicare HMO | Admitting: Physical Therapy

## 2020-12-15 ENCOUNTER — Encounter: Payer: Self-pay | Admitting: Physical Therapy

## 2020-12-15 ENCOUNTER — Ambulatory Visit: Payer: Medicare HMO | Attending: Neurology | Admitting: Physical Therapy

## 2020-12-15 ENCOUNTER — Other Ambulatory Visit: Payer: Self-pay

## 2020-12-15 DIAGNOSIS — R293 Abnormal posture: Secondary | ICD-10-CM | POA: Diagnosis not present

## 2020-12-15 DIAGNOSIS — M545 Low back pain, unspecified: Secondary | ICD-10-CM | POA: Insufficient documentation

## 2020-12-15 DIAGNOSIS — M6281 Muscle weakness (generalized): Secondary | ICD-10-CM | POA: Insufficient documentation

## 2020-12-15 NOTE — Therapy (Signed)
[facility] [address] [place], [place], [address] Phone: [phone]   Fax:  [phone]  Physical Therapy Treatment  Patient Details  Name: Erica Schultz MRN: [id number] Date of Birth: 1950-12-17 Referring Provider (PT): Jennings Books   Encounter Date: 12/15/2020   PT End of Session - 12/15/20 1336     Visit Number 16    Number of Visits 27    Date for PT Re-Evaluation 01/26/21    Authorization Type IE 07/21/20    PT Start Time 1330    PT Stop Time 1410    PT Time Calculation (min) 40 min    Activity Tolerance Patient tolerated treatment well    Behavior During Therapy [facility] for tasks assessed/performed             Past Medical History:  Diagnosis Date   Hypertension     Past Surgical History:  Procedure Laterality Date   BREAST CYST ASPIRATION Right 5859   UMBILICAL HERNIA REPAIR  2017    There were no vitals filed for this visit.   Subjective Assessment - 12/15/20 1333     Subjective Patient notes that she continues to feel her job is a barrier to her success with PT. Patient also reports increased difficulty with sleeping over the past few weeks. Patient also is having increased foot and back pain.    Currently in Pain? Yes    Pain Score 8     Pain Location Back    Pain Orientation Lower              TREATMENT  Neuromuscular Re-education: Supine diaphragmatic breathing with VCs and TCs for downregulation of the nervous system and improved management of IAP Reassessed goals; see below.  Patient discussion on pain management/modulation in the presence of continued exacerbating activity (occupational demands, interrupted/insufficient sleep).    Treatments unbilled: MHP to lumbar spine during neuromuscular re-education IFC, lumbar region, 19.5 mV, 90 degree sweep, 80-100 bps for pain relief. No skin irritation or signs of burn noted after removal of pads. 20 minutes during neuromuscular  re-education.  Patient educated throughout session on appropriate technique and form using multi-modal cueing, HEP, and activity modification. Patient articulated understanding and returned demonstration.  Patient Response to interventions: 3/10 pain at L SIJ  ASSESSMENT Patient presents to clinic with excellent motivation to participate in therapy. Patient demonstrates deficits in pain free spinal ROM, posture, gait, hamstring extensibility, and L hip IR strength. Patient had increased pain intensity during today's session and responded positively to pain modulating interventions with significant decrease in pain. Patient continues to have positive response to physical therapy; however, the response is variable and dependent on occupational demands, which seem to be a significant barrier to long-term progress and independent management. Patient continues to apply HEP and self-management techniques and has yet to achieve maximum improvement. Patient will benefit from continued skilled therapeutic intervention to address remaining deficits in pain free spinal ROM, posture, gait, hamstring extensibility, and L hip IR strength in order to increase function and improve overall QOL.    PT Long Term Goals - 12/15/20 1621       PT LONG TERM GOAL #1   Title Patient will be independent with HEP in order to improve strength and decrease back pain in order to improve pain-free function at home and work.    Baseline IE: not initiated; 4/12: IND    Time 6    Period Weeks    Status Achieved  PT LONG TERM GOAL #2   Title Patient will decrease worst back pain (as reported over a given 7 day period) on NPRS by at least 2 points in order to demonstrate clinically significant reduction in back pain.    Baseline IE: 10/10; 4/12: 9/10; 5/10: 5-6/10; 7/19: 35/10    Time 6    Period Weeks    Status On-going    Target Date 01/26/21      PT LONG TERM GOAL #3   Title Patient will report "a little bit of  difficulty" or "no difficulty" with going up/down 2 flights of stairs in order to safely participate in activities at home and in the community.    Baseline IE: "quite a bit of difficulty"; 4/12: not assessed; 5/10: after work "quite a bit of difficulty", on days off or before work "no difficulty"; 7/19:  after work "quite a bit of difficulty", on days off or before work "no difficulty"/"a little bit of difficulty"    Time 6    Period Weeks    Status Partially Met    Target Date 01/26/21      PT LONG TERM GOAL #4   Title Patient will demonstrate improved function as evidenced by a score of 50 on FOTO measure for full participation in activities at home and in the community.    Baseline IE: 44; 4/12: not assessed; 5/10: 56    Time 6    Period Weeks    Status Achieved      PT LONG TERM GOAL #5   Title Patient will demonstrate understanding of basic self-management/down-regulation of the nervous system for persistent pain condition and stress as evidenced by diaphragmatic breathing without cueing, body scan/progressive relaxation meditation, and improved sleep hygiene in order to transition to independent management of patient's chief complaint: persistent lumbar pain.    Baseline IE: not demonstrated; 4/12: practicing body scan and diaphragmatic breathing at home; 5/10: practicing body scan, diaphragmatic breathing, boundary setting    Time 6    Period Weeks    Status Achieved                   Plan - 12/15/20 1459     Clinical Impression Statement Patient presents to clinic with excellent motivation to participate in therapy. Patient demonstrates deficits in pain free spinal ROM, posture, gait, hamstring extensibility, and L hip IR strength. Patient had increased pain intensity during today's session and responded positively to pain modulating interventions with significant decrease in pain. Patient continues to have positive response to physical therapy; however, the response is  variable and dependent on occupational demands, which seem to be a significant barrier to long-term progress and independent management. Patient continues to apply HEP and self-management techniques and has yet to achieve maximum improvement. Patient will benefit from continued skilled therapeutic intervention to address remaining deficits in pain free spinal ROM, posture, gait, hamstring extensibility, and L hip IR strength in order to increase function and improve overall QOL.    Personal Factors and Comorbidities Age;Past/Current Experience;Time since onset of injury/illness/exacerbation;Comorbidity 3+;Profession    Comorbidities HTN, OSA, DM, hyperlipidemia, migraine    Examination-Activity Limitations Squat;Lift;Bend;Stand;Locomotion Level;Carry;Sleep;Sit;Reach Overhead;Stairs    Transport planner;Occupation;Driving    Stability/Clinical Decision Making Evolving/Moderate complexity    Rehab Potential Fair    PT Frequency 1x / week    PT Duration 6 weeks    PT Treatment/Interventions Moist Heat;Cryotherapy;Therapeutic activities;Neuromuscular re-education;Therapeutic exercise;Patient/family education;Balance training;Functional mobility training;Stair training;Gait training;Manual techniques;Taping;Vestibular;Orthotic Fit/Training;ADLs/Self Care Home Management  PT Home Exercise Plan LBP gentle series with TrA basics    Consulted and Agree with Plan of Care Patient             Patient will benefit from skilled therapeutic intervention in order to improve the following deficits and impairments:  Abnormal gait, Decreased activity tolerance, Decreased coordination, Decreased strength, Postural dysfunction, Improper body mechanics, Pain, Difficulty walking, Decreased balance, Decreased endurance  Visit Diagnosis: Low back pain, unspecified back pain laterality, unspecified chronicity, unspecified whether sciatica present  Muscle  weakness (generalized)  Abnormal posture     Problem List There are no problems to display for this patient.   Myles Gip PT, DPT [phone]  12/15/2020, 4:25 PM  [facility] [address] [place], [place], [address] Phone: [phone]   Fax:  [phone]  Name: Erica Schultz MRN: [id number] Date of Birth: Jun 10, 1950

## 2020-12-22 ENCOUNTER — Encounter: Payer: Medicare HMO | Admitting: Physical Therapy

## 2020-12-22 DIAGNOSIS — Z20822 Contact with and (suspected) exposure to covid-19: Secondary | ICD-10-CM | POA: Diagnosis not present

## 2021-01-04 ENCOUNTER — Emergency Department: Payer: Medicare HMO

## 2021-01-04 ENCOUNTER — Other Ambulatory Visit: Payer: Self-pay

## 2021-01-04 ENCOUNTER — Encounter: Payer: Self-pay | Admitting: Emergency Medicine

## 2021-01-04 ENCOUNTER — Emergency Department
Admission: EM | Admit: 2021-01-04 | Discharge: 2021-01-04 | Disposition: A | Discharge status: Home / Self Care | Payer: Medicare HMO | Attending: Student in an Organized Health Care Education/Training Program | Admitting: Student in an Organized Health Care Education/Training Program

## 2021-01-04 DIAGNOSIS — U071 COVID-19: Secondary | ICD-10-CM | POA: Diagnosis not present

## 2021-01-04 DIAGNOSIS — Z79899 Other long term (current) drug therapy: Secondary | ICD-10-CM | POA: Insufficient documentation

## 2021-01-04 DIAGNOSIS — I10 Essential (primary) hypertension: Secondary | ICD-10-CM | POA: Diagnosis not present

## 2021-01-04 NOTE — ED Notes (Signed)
See triage note  States she developed runny nose on Friday  Did home test which was positive  States she needs her work note filled out    Afebrile at presents   States she has been [place] vaccined

## 2021-01-04 NOTE — ED Provider Notes (Signed)
[facility] Emergency Department Provider Note    Event Date/Time   First MD Initiated Contact with Patient 01/04/21 1349     (approximate)  I have reviewed the triage vital signs and the nursing notes.   HISTORY  Chief Complaint No chief complaint on file.    HPI Erica Schultz is a 70 y.o. female below listed past medical history presents to the ER for paperwork to be signed so that she can obtain work clearance and coverage at [employer] she tested positive for COVID on Friday.  States she has been having headache and some congestion but otherwise feels well.  No nausea or vomiting.  Did feel anxious and had trouble sleeping over the weekend but slept well last night denies any shortness of breath or discomfort at this time.  Otherwise feels pretty well  Past Medical History:  Diagnosis Date   Hypertension    No family history on file. Past Surgical History:  Procedure Laterality Date   BREAST CYST ASPIRATION Right [id number]   UMBILICAL HERNIA REPAIR  2017   There are no problems to display for this patient.     Prior to Admission medications   Medication Sig Start Date End Date Taking? Authorizing Provider  amLODipine (NORVASC) 2.5 MG tablet Take 1 tablet by mouth daily. Patient not taking: Reported on 03/12/2020 01/13/20   [provider]  atorvastatin (LIPITOR) 40 MG tablet Take 1 tablet by mouth daily. Patient not taking: Reported on 03/12/2020 03/10/20   [provider]  estradiol (ESTRACE) 0.5 MG tablet Take 1 tablet by mouth daily. Patient not taking: Reported on 03/12/2020 03/10/20   [provider]  ferrous sulfate 325 (65 FE) MG EC tablet Take by mouth. Patient not taking: Reported on 03/12/2020    [provider]  glucosamine-chondroitin 500-400 MG tablet Take by mouth. Patient not taking: Reported on 03/12/2020    [provider]  latanoprost (XALATAN) 0.005 % ophthalmic solution Apply to  eye. Patient not taking: Reported on 03/12/2020    [provider]  metFORMIN (GLUCOPHAGE-XR) 500 MG 24 hr tablet  01/14/20   [provider]  Omega-3 Fatty Acids (FISH OIL) 1000 MG CAPS Take by mouth.    [provider]    Allergies Losartan and Lisinopril    Social History    Review of Systems Patient denies headaches, rhinorrhea, blurry vision, numbness, shortness of breath, chest pain, edema, cough, abdominal pain, nausea, vomiting, diarrhea, dysuria, fevers, rashes or hallucinations unless otherwise stated above in HPI. ____________________________________________   PHYSICAL EXAM:  VITAL SIGNS: Vitals:   01/04/21 1249  BP: (!) 152/89  Pulse: 86  Resp: 20  Temp: 98.3 F (36.8 C)  SpO2: 100%    Constitutional: Alert and oriented.  Eyes: Conjunctivae are normal.  Head: Atraumatic. Nose: No congestion/rhinnorhea. Mouth/Throat: Mucous membranes are moist.   Neck: No stridor. Painless ROM.  Cardiovascular: Normal rate, regular rhythm. Grossly normal heart sounds.  Good peripheral circulation. Respiratory: Normal respiratory effort.  No retractions. Lungs CTAB. Gastrointestinal: Soft and nontender. No distention. No abdominal bruits. No CVA tenderness. Genitourinary:  Musculoskeletal: No lower extremity tenderness nor edema.  No joint effusions. Neurologic:  Normal speech and language. No gross focal neurologic deficits are appreciated. No facial droop Skin:  Skin is warm, dry and intact. No rash noted. Psychiatric: Mood and affect are normal. Speech and behavior are normal.  ____________________________________________   LABS (all labs ordered are listed, but only abnormal results are displayed)  No  results found for this or any previous visit (from the past 24 hour(s)). ____________________________________________ ______________________  [id number]   ____________________________________________   PROCEDURES  Procedure(s)  performed:  Procedures    Critical Care performed: no ____________________________________________   INITIAL IMPRESSION / ASSESSMENT AND PLAN / ED COURSE  Pertinent labs & imaging results that were available during my care of the patient were reviewed by me and considered in my medical decision making (see chart for details).   DDX: COVID-19, pneumonia, electrolyte abnormality, dehydration, sepsis  Erica Schultz is a 70 y.o. who presents to the ED with presentation as described above.  Patient clinically very well-appearing afebrile hemodynamically stable.  Has good air movement throughout.  Recommended blood work as well as x-ray patient deferred this as she states she is otherwise feeling well and her primary reason for visit was documentation for work.  Offered paxlovid but declined by patient.  Unfortunately able to fill out FMLA paperwork and was encouraged to follow-up with her PCP regarding this documentation.  Discussed signs symptoms for which patient should return to the ER.  Patient agreeable to plan.     The patient was evaluated in Emergency Department today for the symptoms described in the history of present illness. He/she was evaluated in the context of the global COVID-19 pandemic, which necessitated consideration that the patient might be at risk for infection with the SARS-CoV-2 virus that causes COVID-19. Institutional protocols and algorithms that pertain to the evaluation of patients at risk for COVID-19 are in a state of rapid change based on information released by regulatory bodies including the CDC and federal and state organizations. These policies and algorithms were followed during the patient's care in the ED.  As part of my medical decision making, I reviewed the following data within the [facility] notes reviewed and incorporated, Labs reviewed, notes from prior ED visits and [place] Controlled Substance  Database   ____________________________________________   FINAL CLINICAL IMPRESSION(S) / ED DIAGNOSES  Final diagnoses:  COVID-19 virus infection      NEW MEDICATIONS STARTED DURING THIS VISIT:  New Prescriptions   No medications on file     Note:  This document was prepared using Dragon voice recognition software and may include unintentional dictation errors.    Merlyn Lot, MD 01/04/21 [phone]

## 2021-01-04 NOTE — ED Triage Notes (Signed)
Pt reports that she has tested positive for COVID, she states that her main concern is her headache. Her job has given her a paper that they need filled out that she has COVID and what her restrictions are. She does state that she is afraid to go to sleep at night because she lives alone and becomes [facility]. Pt is able to speak in complete clear sentences. VSS.

## 2021-01-05 ENCOUNTER — Ambulatory Visit: Payer: Medicare HMO | Admitting: Physical Therapy

## 2021-01-11 DIAGNOSIS — Z8616 Personal history of COVID-19: Secondary | ICD-10-CM | POA: Diagnosis not present

## 2021-01-12 ENCOUNTER — Ambulatory Visit: Payer: Medicare HMO | Admitting: Physical Therapy

## 2021-01-19 ENCOUNTER — Encounter: Payer: Self-pay | Admitting: Physical Therapy

## 2021-01-19 ENCOUNTER — Ambulatory Visit: Payer: Medicare HMO | Attending: Neurology | Admitting: Physical Therapy

## 2021-01-19 ENCOUNTER — Other Ambulatory Visit: Payer: Self-pay

## 2021-01-19 DIAGNOSIS — M545 Low back pain, unspecified: Secondary | ICD-10-CM | POA: Diagnosis not present

## 2021-01-19 DIAGNOSIS — R293 Abnormal posture: Secondary | ICD-10-CM | POA: Diagnosis not present

## 2021-01-19 DIAGNOSIS — M6281 Muscle weakness (generalized): Secondary | ICD-10-CM | POA: Insufficient documentation

## 2021-01-19 NOTE — Therapy (Signed)
[facility] [address] [place], [place], [address] Phone: [phone]   Fax:  [phone]  Physical Therapy Treatment  Patient Details  Name: Erica Schultz MRN: [id number] Date of Birth: 05/02/1951 Referring Provider (PT): Jennings Books   Encounter Date: 01/19/2021   PT End of Session - 01/19/21 1345     Visit Number 17    Number of Visits 27    Date for PT Re-Evaluation 01/26/21    Authorization Type IE 07/21/20    PT Start Time 1330    PT Stop Time 1410    PT Time Calculation (min) 40 min    Activity Tolerance Patient tolerated treatment well    Behavior During Therapy [facility] for tasks assessed/performed             Past Medical History:  Diagnosis Date   Hypertension     Past Surgical History:  Procedure Laterality Date   BREAST CYST ASPIRATION Right [id number]   UMBILICAL HERNIA REPAIR  2017    There were no vitals filed for this visit.   Subjective Assessment - 01/19/21 1334     Subjective Patient notes she has fully recovered from recent bout of Covid. Patient notes work continues to be a trigger for pain. Patient did note while away on vacation that her back pain was well-controlled and not limiting.    Currently in Pain? Yes    Pain Score 4     Pain Location Back    Pain Orientation Lower             TREATMENT  Neuromuscular Re-education: Sidelying diaphragmatic breathing with VCs and TCs for downregulation of the nervous system and improved management of IAP Reassessed goals; see below.  Manual Therapy: STM and TPR performed to L gluteal mm and B calves and feet to allow for decreased tension and pain and improved posture and function   Patient educated throughout session on appropriate technique and form using multi-modal cueing, HEP, and activity modification. Patient articulated understanding and returned demonstration.  Patient Response to interventions: Notes she feels  looser.  ASSESSMENT Patient presents to clinic with excellent motivation to participate in therapy. Patient demonstrates deficits in pain free spinal ROM, posture, gait, hamstring extensibility, and L hip IR strength. Patient continues to indicate progress toward goals with respect to function and FOTO measure during today's session and responded positively to manual interventions. Patient will benefit from continued skilled therapeutic intervention to address remaining deficits in pain free spinal ROM, posture, gait, hamstring extensibility, and L hip IR strength in order to increase function and improve overall QOL.    PT Long Term Goals - 01/19/21 1338       PT LONG TERM GOAL #1   Title Patient will be independent with HEP in order to improve strength and decrease back pain in order to improve pain-free function at home and work.    Baseline IE: not initiated; 4/12: IND    Time 6    Period Weeks    Status Achieved      PT LONG TERM GOAL #2   Title Patient will decrease worst back pain (as reported over a given 7 day period) on NPRS by at least 2 points in order to demonstrate clinically significant reduction in back pain.    Baseline IE: 10/10; 4/12: 9/10; 5/10: 5-6/10; 7/19: 5/10; 8/23: 10/10 (after working 13 hour shift)    Time 6    Period Weeks    Status On-going  Target Date 01/26/21      PT LONG TERM GOAL #3   Title Patient will report "a little bit of difficulty" or "no difficulty" with going up/down 2 flights of stairs in order to safely participate in activities at home and in the community.    Baseline IE: "quite a bit of difficulty"; 4/12: not assessed; 5/10: after work "quite a bit of difficulty", on days off or before work "no difficulty"; 7/19:  after work "quite a bit of difficulty", on days off or before work "no difficulty"/"a little bit of difficulty"; 8/23: "a little bit of difficulty"    Time 6    Period Weeks    Status Partially Met    Target Date 01/26/21       PT LONG TERM GOAL #4   Title Patient will demonstrate improved function as evidenced by a score of 50 on FOTO measure for full participation in activities at home and in the community.    Baseline IE: 44; 4/12: not assessed; 5/10: 56; 8/23: 68    Time 6    Period Weeks    Status Achieved      PT LONG TERM GOAL #5   Title Patient will demonstrate understanding of basic self-management/down-regulation of the nervous system for persistent pain condition and stress as evidenced by diaphragmatic breathing without cueing, body scan/progressive relaxation meditation, and improved sleep hygiene in order to transition to independent management of patient's chief complaint: persistent lumbar pain.    Baseline IE: not demonstrated; 4/12: practicing body scan and diaphragmatic breathing at home; 5/10: practicing body scan, diaphragmatic breathing, boundary setting    Time 6    Period Weeks    Status Achieved                   Plan - 01/19/21 1346     Clinical Impression Statement Patient presents to clinic with excellent motivation to participate in therapy. Patient demonstrates deficits in pain free spinal ROM, posture, gait, hamstring extensibility, and L hip IR strength. Patient continues to indicate progress toward goals with respect to function and FOTO measure during today's session and responded positively to manual interventions. Patient will benefit from continued skilled therapeutic intervention to address remaining deficits in pain free spinal ROM, posture, gait, hamstring extensibility, and L hip IR strength in order to increase function and improve overall QOL.    Personal Factors and Comorbidities Age;Past/Current Experience;Time since onset of injury/illness/exacerbation;Comorbidity 3+;Profession    Comorbidities HTN, OSA, DM, hyperlipidemia, migraine    Examination-Activity Limitations Squat;Lift;Bend;Stand;Locomotion Level;Carry;Sleep;Sit;Reach Overhead;Stairs     Transport planner;Occupation;Driving    Stability/Clinical Decision Making Evolving/Moderate complexity    Rehab Potential Fair    PT Frequency 1x / week    PT Duration 6 weeks    PT Treatment/Interventions Moist Heat;Cryotherapy;Therapeutic activities;Neuromuscular re-education;Therapeutic exercise;Patient/family education;Balance training;Functional mobility training;Stair training;Gait training;Manual techniques;Taping;Vestibular;Orthotic Fit/Training;ADLs/Self Care Home Management    PT Home Exercise Plan LBP gentle series with TrA basics    Consulted and Agree with Plan of Care Patient             Patient will benefit from skilled therapeutic intervention in order to improve the following deficits and impairments:  Abnormal gait, Decreased activity tolerance, Decreased coordination, Decreased strength, Postural dysfunction, Improper body mechanics, Pain, Difficulty walking, Decreased balance, Decreased endurance  Visit Diagnosis: Low back pain, unspecified back pain laterality, unspecified chronicity, unspecified whether sciatica present  Muscle weakness (generalized)  Abnormal posture     Problem List There are  no problems to display for this patient.   Myles Gip PT, DPT [phone]  01/19/2021, 2:48 PM  [facility] [address] [place], [place], [address] Phone: [phone]   Fax:  [phone]  Name: Erica Schultz MRN: [id number] Date of Birth: 1951/01/29

## 2021-01-26 ENCOUNTER — Encounter: Payer: Self-pay | Admitting: Physical Therapy

## 2021-01-26 ENCOUNTER — Other Ambulatory Visit: Payer: Self-pay

## 2021-01-26 ENCOUNTER — Ambulatory Visit: Payer: Medicare HMO | Admitting: Physical Therapy

## 2021-01-26 DIAGNOSIS — R293 Abnormal posture: Secondary | ICD-10-CM

## 2021-01-26 DIAGNOSIS — M545 Low back pain, unspecified: Secondary | ICD-10-CM

## 2021-01-26 DIAGNOSIS — M6281 Muscle weakness (generalized): Secondary | ICD-10-CM | POA: Diagnosis not present

## 2021-01-26 NOTE — Therapy (Signed)
[facility] [address] [place], [place], [address] Phone: [phone]   Fax:  [phone]  Physical Therapy Treatment  Patient Details  Name: Erica Schultz MRN: [id number] Date of Birth: 06/15/1950 Referring Provider (PT): Jennings Books   Encounter Date: 01/26/2021   PT End of Session - 01/26/21 1327     Visit Number 18    Number of Visits 39    Date for PT Re-Evaluation 04/20/21    Authorization Type IE 07/21/20    PT Start Time 1325    PT Stop Time 1405    PT Time Calculation (min) 40 min    Activity Tolerance Patient tolerated treatment well    Behavior During Therapy [facility] for tasks assessed/performed             Past Medical History:  Diagnosis Date   Hypertension     Past Surgical History:  Procedure Laterality Date   BREAST CYST ASPIRATION Right 7543   UMBILICAL HERNIA REPAIR  2017    There were no vitals filed for this visit.   Subjective Assessment - 01/26/21 1322     Subjective Patient notes she is feeling okay today. Patient notes that she had trouble sleeping Sunday and on Monday took two tylenol PM to help with sleep. Patient notes that she feels food getting stuck in her esophagus with her hiatal hernia and this is causing her to have coughing after eating.    Currently in Pain? Yes    Pain Score 3     Pain Location Back              TREATMENT  Neuromuscular Re-education: Supine diaphragmatic breathing with VCs and TCs for downregulation of the nervous system and improved management of IAP Supine pelvic tilts with coordinated breath for improved lumbar mobility and decreased pain Supine figure 4 stretch for improved lumbar mobility and decreased pain   Treatments unbilled: MHP to lumbar spine during neuromuscular re-education IFC, lumbar region, 21.0 mV, 90 degree sweep, 80-100 bps for pain relief. No skin irritation or signs of burn noted after removal of pads. 20 minutes during  neuromuscular re-education.  Patient educated throughout session on appropriate technique and form using multi-modal cueing, HEP, and activity modification. Patient articulated understanding and returned demonstration.  Patient Response to interventions: 0/10 pain at L SIJ  ASSESSMENT Patient presents to clinic with excellent motivation to participate in therapy. Patient demonstrates deficits in pain free spinal ROM, posture, gait, hamstring extensibility, and L hip IR strength. Patient has made significant progress toward goals and is indicating readiness to trial self-management during today's session. Patient continues to respond well to gentle spinal mobility and postural interventions. Patient's condition has the potential to improve in response to therapy. Maximum improvement is yet to be obtained. The anticipated improvement is attainable and reasonable in a generally predictable time. Patient will benefit from continued skilled therapeutic intervention to address remaining deficits in pain free spinal ROM, posture, gait, hamstring extensibility, and L hip IR strength in order to increase function and improve overall QOL.     PT Long Term Goals - 01/26/21 1442       PT LONG TERM GOAL #1   Title Patient will be independent with HEP in order to improve strength and decrease back pain in order to improve pain-free function at home and work.    Baseline IE: not initiated; 4/12: IND    Time 6    Period Weeks    Status  Achieved      PT LONG TERM GOAL #2   Title Patient will decrease worst back pain (as reported over a given 7 day period) on NPRS by at least 2 points in order to demonstrate clinically significant reduction in back pain.    Baseline IE: 10/10; 4/12: 9/10; 5/10: 5-6/10; 7/19: 5/10; 8/23: 10/10 (after working 13 hour shift)    Time 12    Period Weeks    Status On-going    Target Date 04/20/21      PT LONG TERM GOAL #3   Title Patient will report "a little bit of  difficulty" or "no difficulty" with going up/down 2 flights of stairs in order to safely participate in activities at home and in the community.    Baseline IE: "quite a bit of difficulty"; 4/12: not assessed; 5/10: after work "quite a bit of difficulty", on days off or before work "no difficulty"; 7/19:  after work "quite a bit of difficulty", on days off or before work "no difficulty"/"a little bit of difficulty"; 8/23: "a little bit of difficulty"    Time 12    Period Weeks    Status Partially Met    Target Date 04/20/21      PT LONG TERM GOAL #4   Title Patient will demonstrate improved function as evidenced by a score of 50 on FOTO measure for full participation in activities at home and in the community.    Baseline IE: 44; 4/12: not assessed; 5/10: 56; 8/23: 68    Time 6    Period Weeks    Status Achieved      PT LONG TERM GOAL #5   Title Patient will demonstrate understanding of basic self-management/down-regulation of the nervous system for persistent pain condition and stress as evidenced by diaphragmatic breathing without cueing, body scan/progressive relaxation meditation, and improved sleep hygiene in order to transition to independent management of patient's chief complaint: persistent lumbar pain.    Baseline IE: not demonstrated; 4/12: practicing body scan and diaphragmatic breathing at home; 5/10: practicing body scan, diaphragmatic breathing, boundary setting    Time 6    Period Weeks    Status Achieved                   Plan - 01/26/21 1330     Clinical Impression Statement Patient presents to clinic with excellent motivation to participate in therapy. Patient demonstrates deficits in pain free spinal ROM, posture, gait, hamstring extensibility, and L hip IR strength. Patient has made significant progress toward goals and is indicating readiness to trial self-management during today's session. Patient continues to respond well to gentle spinal mobility and  postural interventions. Patient's condition has the potential to improve in response to therapy. Maximum improvement is yet to be obtained. The anticipated improvement is attainable and reasonable in a generally predictable time. Patient will benefit from continued skilled therapeutic intervention to address remaining deficits in pain free spinal ROM, posture, gait, hamstring extensibility, and L hip IR strength in order to increase function and improve overall QOL.    Personal Factors and Comorbidities Age;Past/Current Experience;Time since onset of injury/illness/exacerbation;Comorbidity 3+;Profession    Comorbidities HTN, OSA, DM, hyperlipidemia, migraine    Examination-Activity Limitations Squat;Lift;Bend;Stand;Locomotion Level;Carry;Sleep;Sit;Reach Overhead;Stairs    Transport planner;Occupation;Driving    Stability/Clinical Decision Making Evolving/Moderate complexity    Rehab Potential Fair    PT Frequency 1x / week    PT Duration 12 weeks    PT Treatment/Interventions Moist Heat;Cryotherapy;Therapeutic activities;Neuromuscular  re-education;Therapeutic exercise;Patient/family education;Balance training;Functional mobility training;Stair training;Gait training;Manual techniques;Taping;Vestibular;Orthotic Fit/Training;ADLs/Self Care Home Management    PT Home Exercise Plan LBP gentle series with TrA basics    Consulted and Agree with Plan of Care Patient             Patient will benefit from skilled therapeutic intervention in order to improve the following deficits and impairments:  Abnormal gait, Decreased activity tolerance, Decreased coordination, Decreased strength, Postural dysfunction, Improper body mechanics, Pain, Difficulty walking, Decreased balance, Decreased endurance  Visit Diagnosis: Low back pain, unspecified back pain laterality, unspecified chronicity, unspecified whether sciatica present  Muscle weakness  (generalized)  Abnormal posture     Problem List There are no problems to display for this patient.   Myles Gip PT, DPT [phone]  01/26/2021, 2:47 PM  [facility] [address] [place], [place], [address] Phone: [phone]   Fax:  [phone]  Name: Erica Schultz MRN: [id number] Date of Birth: 1950-10-25

## 2021-02-10 DIAGNOSIS — M2042 Other hammer toe(s) (acquired), left foot: Secondary | ICD-10-CM | POA: Diagnosis not present

## 2021-02-10 DIAGNOSIS — K219 Gastro-esophageal reflux disease without esophagitis: Secondary | ICD-10-CM | POA: Diagnosis not present

## 2021-02-10 DIAGNOSIS — M79672 Pain in left foot: Secondary | ICD-10-CM | POA: Diagnosis not present

## 2021-02-10 DIAGNOSIS — M778 Other enthesopathies, not elsewhere classified: Secondary | ICD-10-CM | POA: Diagnosis not present

## 2021-02-10 DIAGNOSIS — R053 Chronic cough: Secondary | ICD-10-CM | POA: Diagnosis not present

## 2021-02-12 DIAGNOSIS — Z111 Encounter for screening for respiratory tuberculosis: Secondary | ICD-10-CM | POA: Diagnosis not present

## 2021-02-16 ENCOUNTER — Ambulatory Visit: Payer: Medicare HMO | Attending: Neurology | Admitting: Physical Therapy

## 2021-02-16 ENCOUNTER — Encounter: Payer: Self-pay | Admitting: Physical Therapy

## 2021-02-16 ENCOUNTER — Other Ambulatory Visit: Payer: Self-pay

## 2021-02-16 DIAGNOSIS — M6281 Muscle weakness (generalized): Secondary | ICD-10-CM | POA: Insufficient documentation

## 2021-02-16 DIAGNOSIS — M545 Low back pain, unspecified: Secondary | ICD-10-CM | POA: Diagnosis not present

## 2021-02-16 DIAGNOSIS — R293 Abnormal posture: Secondary | ICD-10-CM | POA: Diagnosis not present

## 2021-02-16 NOTE — Therapy (Signed)
[facility] [address] [place], [place], [address] Phone: [phone]   Fax:  [phone]  Physical Therapy Treatment  Patient Details  Name: Erica Schultz MRN: [id number] Date of Birth: 03-15-1951 Referring Provider (PT): Jennings Books   Encounter Date: 02/16/2021   PT End of Session - 02/16/21 1644     Visit Number 19    Number of Visits 39    Date for PT Re-Evaluation 04/20/21    Authorization Type IE 07/21/20    PT Start Time 1415    PT Stop Time 1455    PT Time Calculation (min) 40 min    Activity Tolerance Patient tolerated treatment well    Behavior During Therapy [facility] for tasks assessed/performed             Past Medical History:  Diagnosis Date   Hypertension     Past Surgical History:  Procedure Laterality Date   BREAST CYST ASPIRATION Right [id number]   UMBILICAL HERNIA REPAIR  2017    There were no vitals filed for this visit.   Subjective Assessment - 02/16/21 1417     Subjective Patient reports that she is switching jobs and is going to be on her feet less over the course of the week and will be able to have a normal sleep cycle. Patient notes that she is excited about the transition. Patient notes pain has been better, but is a bit stiff right now.    Currently in Pain? Yes    Pain Score 5     Pain Location Back              TREATMENT  Neuromuscular Re-education: Prone quad stretch, B, for improved posture and decreased anterior pelvic tilt Prone iliopsoas stretch, B, for improved posture and decreased anterior pelvic tilt  Manual Therapy: STM and TPR performed to B gluteal mm and B calves and feet to allow for decreased tension and pain and improved posture and function Sacral border mobilizations in prone, B, with gentle superior force for improved MFR and pain modulation  Patient educated throughout session on appropriate technique and form using multi-modal cueing, HEP, and activity  modification. Patient articulated understanding and returned demonstration.  Patient Response to interventions: Comfortable to return in 1 month  ASSESSMENT Patient presents to clinic with excellent motivation to participate in therapy. Patient demonstrates deficits in pain free spinal ROM, posture, gait, hamstring extensibility, and L hip IR strength. Patient responded well to manual and postural interventions during today's session and is comfortable to return to clinic in 1 month after starting new job. Patient will benefit from continued skilled therapeutic intervention to address remaining deficits in pain free spinal ROM, posture, gait, hamstring extensibility, and L hip IR strength in order to increase function and improve overall QOL.    PT Long Term Goals - 01/26/21 1442       PT LONG TERM GOAL #1   Title Patient will be independent with HEP in order to improve strength and decrease back pain in order to improve pain-free function at home and work.    Baseline IE: not initiated; 4/12: IND    Time 6    Period Weeks    Status Achieved      PT LONG TERM GOAL #2   Title Patient will decrease worst back pain (as reported over a given 7 day period) on NPRS by at least 2 points in order to demonstrate clinically significant reduction in back pain.  Baseline IE: 10/10; 4/12: 9/10; 5/10: 5-6/10; 7/19: 5/10; 8/23: 10/10 (after working 13 hour shift)    Time 12    Period Weeks    Status On-going    Target Date 04/20/21      PT LONG TERM GOAL #3   Title Patient will report "a little bit of difficulty" or "no difficulty" with going up/down 2 flights of stairs in order to safely participate in activities at home and in the community.    Baseline IE: "quite a bit of difficulty"; 4/12: not assessed; 5/10: after work "quite a bit of difficulty", on days off or before work "no difficulty"; 7/19:  after work "quite a bit of difficulty", on days off or before work "no difficulty"/"a little bit of  difficulty"; 8/23: "a little bit of difficulty"    Time 12    Period Weeks    Status Partially Met    Target Date 04/20/21      PT LONG TERM GOAL #4   Title Patient will demonstrate improved function as evidenced by a score of 50 on FOTO measure for full participation in activities at home and in the community.    Baseline IE: 44; 4/12: not assessed; 5/10: 56; 8/23: 68    Time 6    Period Weeks    Status Achieved      PT LONG TERM GOAL #5   Title Patient will demonstrate understanding of basic self-management/down-regulation of the nervous system for persistent pain condition and stress as evidenced by diaphragmatic breathing without cueing, body scan/progressive relaxation meditation, and improved sleep hygiene in order to transition to independent management of patient's chief complaint: persistent lumbar pain.    Baseline IE: not demonstrated; 4/12: practicing body scan and diaphragmatic breathing at home; 5/10: practicing body scan, diaphragmatic breathing, boundary setting    Time 6    Period Weeks    Status Achieved                   Plan - 02/16/21 1645     Clinical Impression Statement Patient presents to clinic with excellent motivation to participate in therapy. Patient demonstrates deficits in pain free spinal ROM, posture, gait, hamstring extensibility, and L hip IR strength. Patient responded well to manual and postural interventions during today's session and is comfortable to return to clinic in 1 month after starting new job. Patient will benefit from continued skilled therapeutic intervention to address remaining deficits in pain free spinal ROM, posture, gait, hamstring extensibility, and L hip IR strength in order to increase function and improve overall QOL.    Personal Factors and Comorbidities Age;Past/Current Experience;Time since onset of injury/illness/exacerbation;Comorbidity 3+;Profession    Comorbidities HTN, OSA, DM, hyperlipidemia, migraine     Examination-Activity Limitations Squat;Lift;Bend;Stand;Locomotion Level;Carry;Sleep;Sit;Reach Overhead;Stairs    Transport planner;Occupation;Driving    Stability/Clinical Decision Making Evolving/Moderate complexity    Rehab Potential Fair    PT Frequency 1x / week    PT Duration 12 weeks    PT Treatment/Interventions Moist Heat;Cryotherapy;Therapeutic activities;Neuromuscular re-education;Therapeutic exercise;Patient/family education;Balance training;Functional mobility training;Stair training;Gait training;Manual techniques;Taping;Vestibular;Orthotic Fit/Training;ADLs/Self Care Home Management    PT Home Exercise Plan LBP gentle series with TrA basics    Consulted and Agree with Plan of Care Patient             Patient will benefit from skilled therapeutic intervention in order to improve the following deficits and impairments:  Abnormal gait, Decreased activity tolerance, Decreased coordination, Decreased strength, Postural dysfunction, Improper body mechanics, Pain, Difficulty  walking, Decreased balance, Decreased endurance  Visit Diagnosis: Low back pain, unspecified back pain laterality, unspecified chronicity, unspecified whether sciatica present  Muscle weakness (generalized)  Abnormal posture     Problem List There are no problems to display for this patient.   Myles Gip PT, DPT [phone]  02/16/2021, 4:52 PM  [facility] [address] [place], [place], [address] Phone: [phone]   Fax:  [phone]  Name: Khrystyne Arpin MRN: [id number] Date of Birth: 12/31/1950

## 2021-03-22 ENCOUNTER — Encounter: Payer: Self-pay | Admitting: Physical Therapy

## 2021-04-14 DIAGNOSIS — E119 Type 2 diabetes mellitus without complications: Secondary | ICD-10-CM | POA: Diagnosis not present

## 2021-04-14 DIAGNOSIS — M778 Other enthesopathies, not elsewhere classified: Secondary | ICD-10-CM | POA: Diagnosis not present

## 2021-04-14 DIAGNOSIS — M2042 Other hammer toe(s) (acquired), left foot: Secondary | ICD-10-CM | POA: Diagnosis not present

## 2021-04-14 DIAGNOSIS — M79672 Pain in left foot: Secondary | ICD-10-CM | POA: Diagnosis not present

## 2021-05-05 DIAGNOSIS — E119 Type 2 diabetes mellitus without complications: Secondary | ICD-10-CM | POA: Diagnosis not present

## 2021-05-05 DIAGNOSIS — M778 Other enthesopathies, not elsewhere classified: Secondary | ICD-10-CM | POA: Diagnosis not present

## 2021-05-05 DIAGNOSIS — M7742 Metatarsalgia, left foot: Secondary | ICD-10-CM | POA: Diagnosis not present

## 2021-05-11 DIAGNOSIS — H40003 Preglaucoma, unspecified, bilateral: Secondary | ICD-10-CM | POA: Diagnosis not present

## 2021-05-20 DIAGNOSIS — R059 Cough, unspecified: Secondary | ICD-10-CM | POA: Diagnosis not present

## 2021-05-20 DIAGNOSIS — E785 Hyperlipidemia, unspecified: Secondary | ICD-10-CM | POA: Diagnosis not present

## 2021-05-20 DIAGNOSIS — Z862 Personal history of diseases of the blood and blood-forming organs and certain disorders involving the immune mechanism: Secondary | ICD-10-CM | POA: Diagnosis not present

## 2021-05-20 DIAGNOSIS — E7800 Pure hypercholesterolemia, unspecified: Secondary | ICD-10-CM | POA: Diagnosis not present

## 2021-05-20 DIAGNOSIS — K219 Gastro-esophageal reflux disease without esophagitis: Secondary | ICD-10-CM | POA: Diagnosis not present

## 2021-05-20 DIAGNOSIS — R1314 Dysphagia, pharyngoesophageal phase: Secondary | ICD-10-CM | POA: Diagnosis not present

## 2021-05-20 DIAGNOSIS — E1169 Type 2 diabetes mellitus with other specified complication: Secondary | ICD-10-CM | POA: Diagnosis not present

## 2021-05-27 ENCOUNTER — Other Ambulatory Visit: Payer: Self-pay | Admitting: Otolaryngology

## 2021-05-27 DIAGNOSIS — R1310 Dysphagia, unspecified: Secondary | ICD-10-CM

## 2021-05-27 DIAGNOSIS — R059 Cough, unspecified: Secondary | ICD-10-CM

## 2021-05-27 DIAGNOSIS — K219 Gastro-esophageal reflux disease without esophagitis: Secondary | ICD-10-CM

## 2021-06-03 DIAGNOSIS — D72819 Decreased white blood cell count, unspecified: Secondary | ICD-10-CM | POA: Diagnosis not present

## 2021-06-03 DIAGNOSIS — E119 Type 2 diabetes mellitus without complications: Secondary | ICD-10-CM | POA: Diagnosis not present

## 2021-06-03 DIAGNOSIS — E7800 Pure hypercholesterolemia, unspecified: Secondary | ICD-10-CM | POA: Diagnosis not present

## 2021-06-03 DIAGNOSIS — I10 Essential (primary) hypertension: Secondary | ICD-10-CM | POA: Diagnosis not present

## 2021-06-03 DIAGNOSIS — D649 Anemia, unspecified: Secondary | ICD-10-CM | POA: Diagnosis not present

## 2021-06-09 DIAGNOSIS — M7742 Metatarsalgia, left foot: Secondary | ICD-10-CM | POA: Diagnosis not present

## 2021-06-09 DIAGNOSIS — M778 Other enthesopathies, not elsewhere classified: Secondary | ICD-10-CM | POA: Diagnosis not present

## 2021-06-09 DIAGNOSIS — E119 Type 2 diabetes mellitus without complications: Secondary | ICD-10-CM | POA: Diagnosis not present

## 2021-06-11 ENCOUNTER — Other Ambulatory Visit: Payer: Self-pay | Admitting: Podiatry

## 2021-06-11 DIAGNOSIS — M778 Other enthesopathies, not elsewhere classified: Secondary | ICD-10-CM

## 2021-06-15 ENCOUNTER — Ambulatory Visit: Payer: Medicare HMO | Attending: Otolaryngology

## 2021-06-21 DIAGNOSIS — K219 Gastro-esophageal reflux disease without esophagitis: Secondary | ICD-10-CM | POA: Diagnosis not present

## 2021-06-23 ENCOUNTER — Ambulatory Visit: Payer: Medicare HMO

## 2021-06-25 ENCOUNTER — Ambulatory Visit
Admission: RE | Admit: 2021-06-25 | Discharge: 2021-06-25 | Disposition: A | Discharge status: Home / Self Care | Payer: Medicare HMO | Source: Ambulatory Visit | Attending: Podiatry | Admitting: Podiatry

## 2021-06-25 DIAGNOSIS — R200 Anesthesia of skin: Secondary | ICD-10-CM | POA: Diagnosis not present

## 2021-06-25 DIAGNOSIS — R600 Localized edema: Secondary | ICD-10-CM | POA: Diagnosis not present

## 2021-06-25 DIAGNOSIS — M778 Other enthesopathies, not elsewhere classified: Secondary | ICD-10-CM | POA: Diagnosis not present

## 2021-06-25 DIAGNOSIS — M25475 Effusion, left foot: Secondary | ICD-10-CM | POA: Diagnosis not present

## 2021-06-25 IMAGING — MR MR FOOT*L* W/O CM
5 series · 40 of 40 positions shown · non-contrast
Comparison: None.

CLINICAL DATA: Left toes numbness with burning sensation for 5
years.

EXAM:
MRI OF THE LEFT FOOT WITHOUT CONTRAST
TECHNIQUE: Multiplanar, multisequence MR imaging of the left foot was
performed. No intravenous contrast was administered.

[Series 9: T2 · oblique · left · 3.0mm · 0.70mm/px · 6 of 18 slices shown (1 of 3)]
[im 1/18]
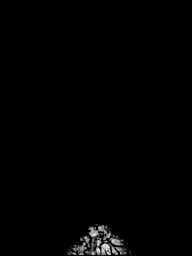
[im 4/18]
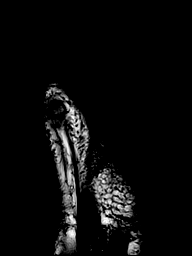
[im 7/18]
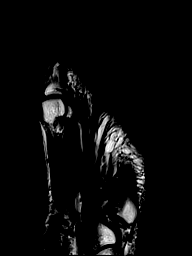
[im 11/18]
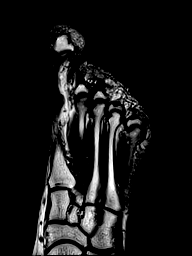
[im 14/18]
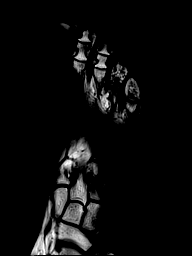
[im 18/18]
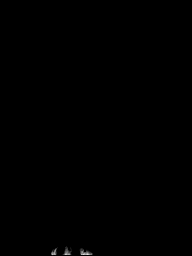

[Series 10: T2 · oblique · left · 3.0mm · 0.70mm/px · 6 of 18 slices shown (2 of 3)]
[im 1/18]
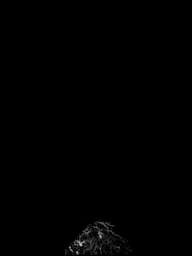
[im 4/18]
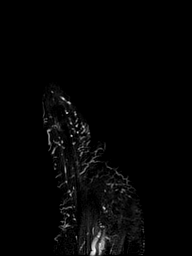
[im 7/18]
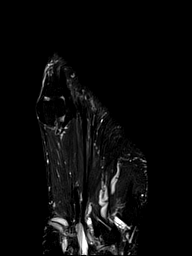
[im 11/18]
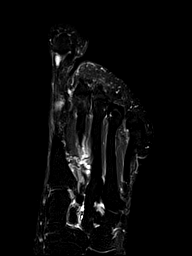
[im 14/18]
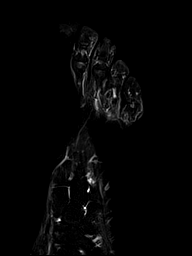
[im 18/18]
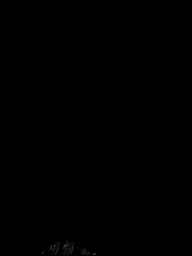

[Series 11: T1 · oblique · left · 3.0mm · 0.70mm/px · 6 of 18 slices shown]
[im 1/18]
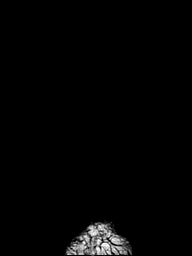
[im 4/18]
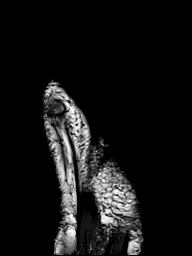
[im 7/18]
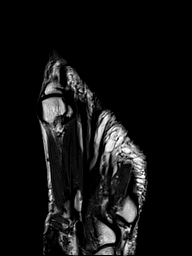
[im 11/18]
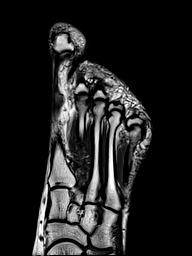
[im 14/18]
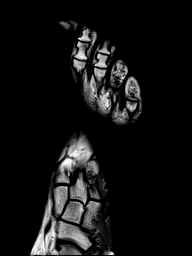
[im 18/18]
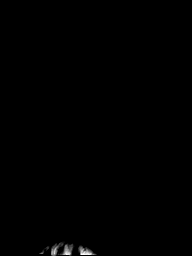

[Series 12: STIR · sagittal · left · 3.0mm · 0.62mm/px · 8 of 26 slices shown]
[im 1/26]
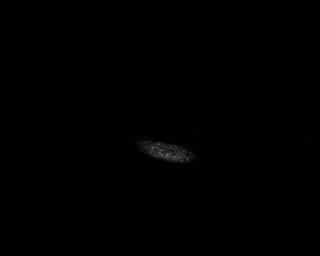
[im 4/26]
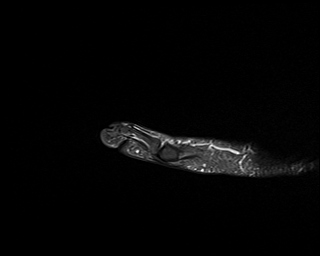
[im 8/26]
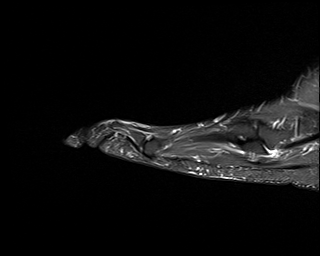
[im 11/26]
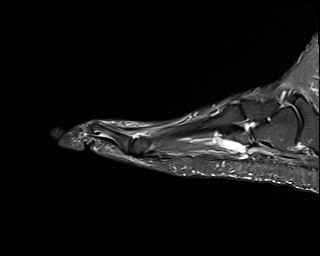
[im 15/26]
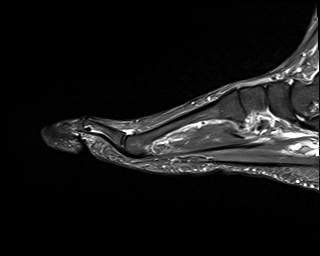
[im 18/26]
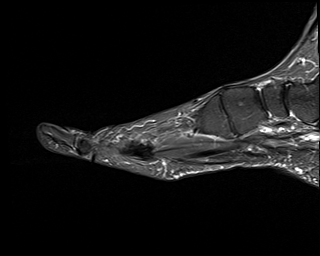
[im 22/26]
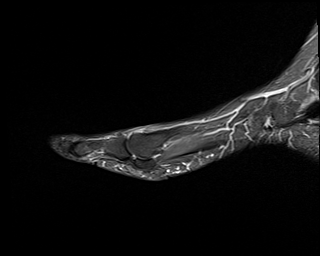
[im 26/26]
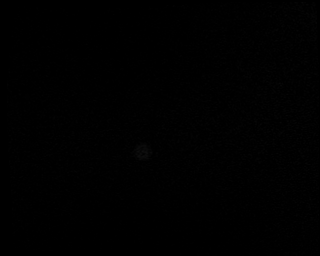

[Series 14: T2 · coronal · left · 3.0mm · 0.38mm/px · 14 of 45 slices shown (3 of 3)]
[im 1/45]
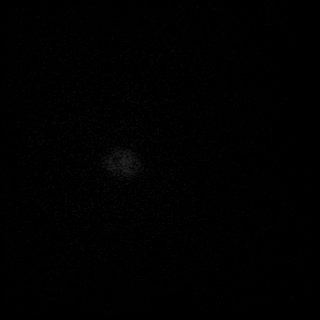
[im 4/45]
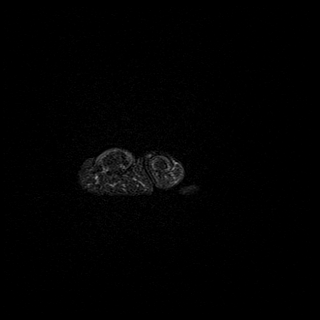
[im 7/45]
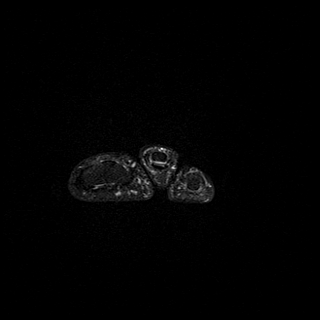
[im 11/45]
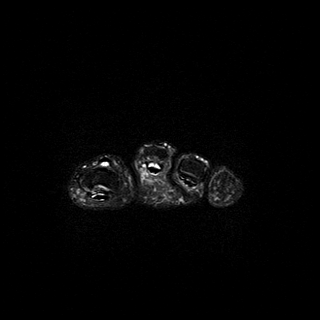
[im 14/45]
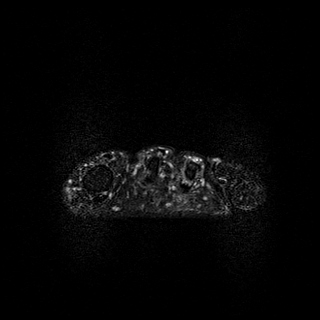
[im 17/45]
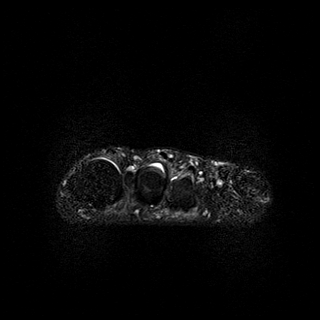
[im 21/45]
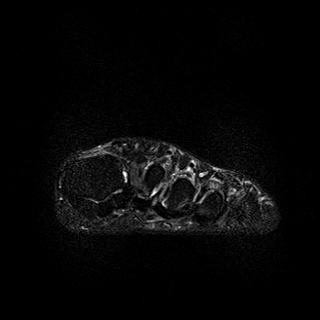
[im 24/45]
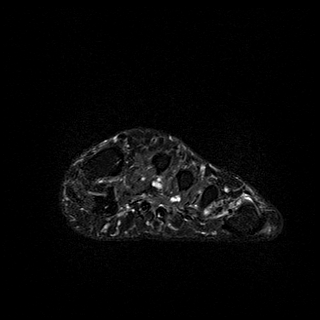
[im 28/45]
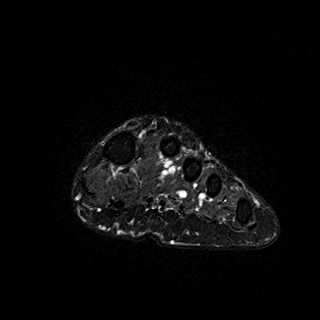
[im 31/45]
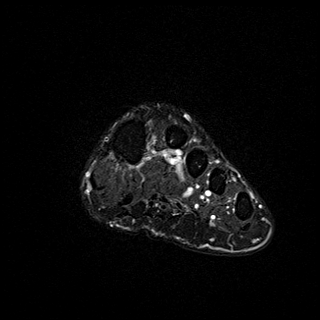
[im 34/45]
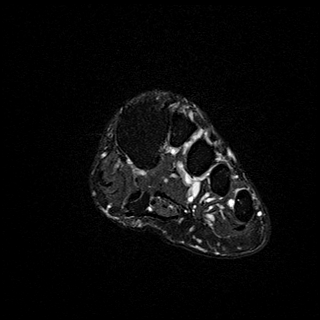
[im 38/45]
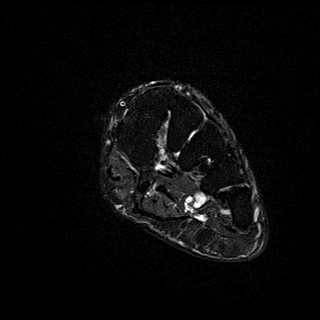
[im 41/45]
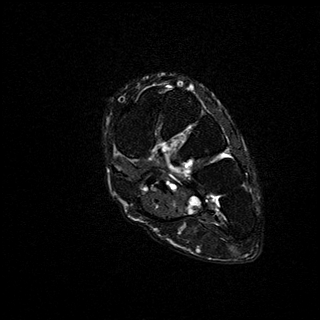
[im 45/45]
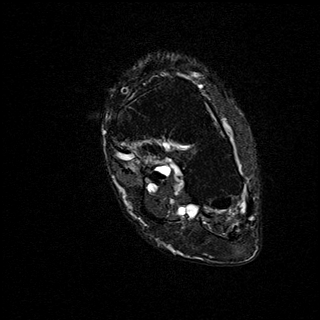

[40 of 40 positions shown; findings below may reference images not displayed]

FINDINGS: Bones/Joint/Cartilage

No acute fracture or dislocation. There is faint, minimal marrow
edema signal within the second metatarsal head and to a lesser
degree in the third metatarsal head. Preserved T1 signal. No
articular surface destruction. Minimal effusion at the second and
third MTP joints.

Ligaments

Intact Lisfranc ligament. Intact MTP collateral ligaments. No
evidence of plantar plate tear.

Muscles and Tendons

No significant muscle edema or muscle atrophy. No acute tendon tear.

Soft tissues

No focal fluid collection.  No evidence of cystic or solid mass.
IMPRESSION: Faint, minimal marrow edema signal within the second and third
metatarsal heads, likely either related to abnormal stress or early
arthritis, with minimal MTP joint effusions. No acute fracture.

## 2021-07-01 ENCOUNTER — Other Ambulatory Visit: Payer: Self-pay | Admitting: Family Medicine

## 2021-07-01 DIAGNOSIS — Z1231 Encounter for screening mammogram for malignant neoplasm of breast: Secondary | ICD-10-CM

## 2021-07-12 DIAGNOSIS — M7742 Metatarsalgia, left foot: Secondary | ICD-10-CM | POA: Diagnosis not present

## 2021-07-12 DIAGNOSIS — E119 Type 2 diabetes mellitus without complications: Secondary | ICD-10-CM | POA: Diagnosis not present

## 2021-07-12 DIAGNOSIS — M778 Other enthesopathies, not elsewhere classified: Secondary | ICD-10-CM | POA: Diagnosis not present

## 2021-07-12 DIAGNOSIS — G5792 Unspecified mononeuropathy of left lower limb: Secondary | ICD-10-CM | POA: Diagnosis not present

## 2021-07-19 DIAGNOSIS — G5792 Unspecified mononeuropathy of left lower limb: Secondary | ICD-10-CM | POA: Diagnosis not present

## 2021-08-09 ENCOUNTER — Ambulatory Visit
Admission: RE | Admit: 2021-08-09 | Discharge: 2021-08-09 | Disposition: A | Discharge status: Home / Self Care | Payer: Medicare HMO | Source: Ambulatory Visit | Attending: Family Medicine | Admitting: Family Medicine

## 2021-08-09 ENCOUNTER — Other Ambulatory Visit: Payer: Self-pay

## 2021-08-09 DIAGNOSIS — Z1231 Encounter for screening mammogram for malignant neoplasm of breast: Secondary | ICD-10-CM | POA: Insufficient documentation

## 2021-08-09 IMAGING — MG MM DIGITAL SCREENING BILAT W/ TOMO AND CAD
8 series · 8 of 24 positions shown · non-contrast
Comparison: Previous exam(s).

CLINICAL DATA: Screening.

EXAM:
DIGITAL SCREENING BILATERAL MAMMOGRAM WITH TOMOSYNTHESIS AND CAD
TECHNIQUE: Bilateral screening digital craniocaudal and mediolateral oblique
mammograms were obtained. Bilateral screening digital breast
tomosynthesis was performed. The images were evaluated with
computer-aided detection.

[L CC synth-2D]
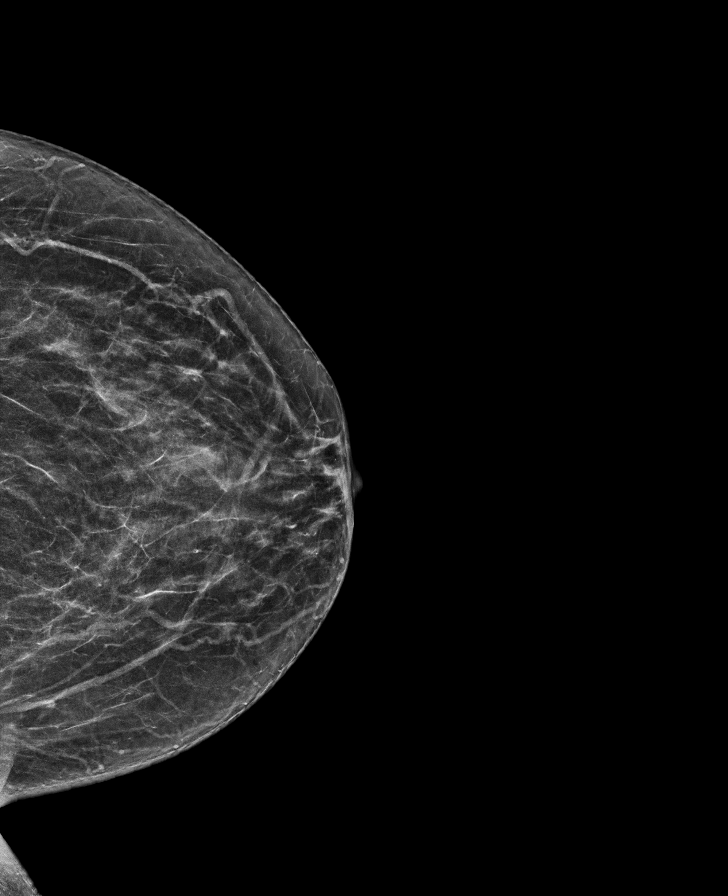

[L MLO synth-2D]
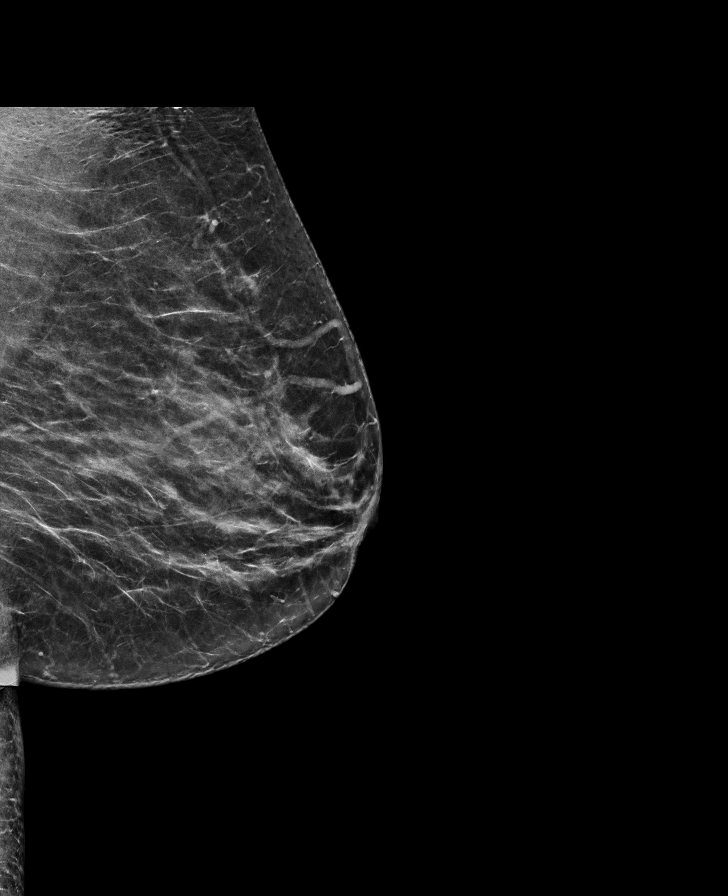

[R MLO synth-2D]
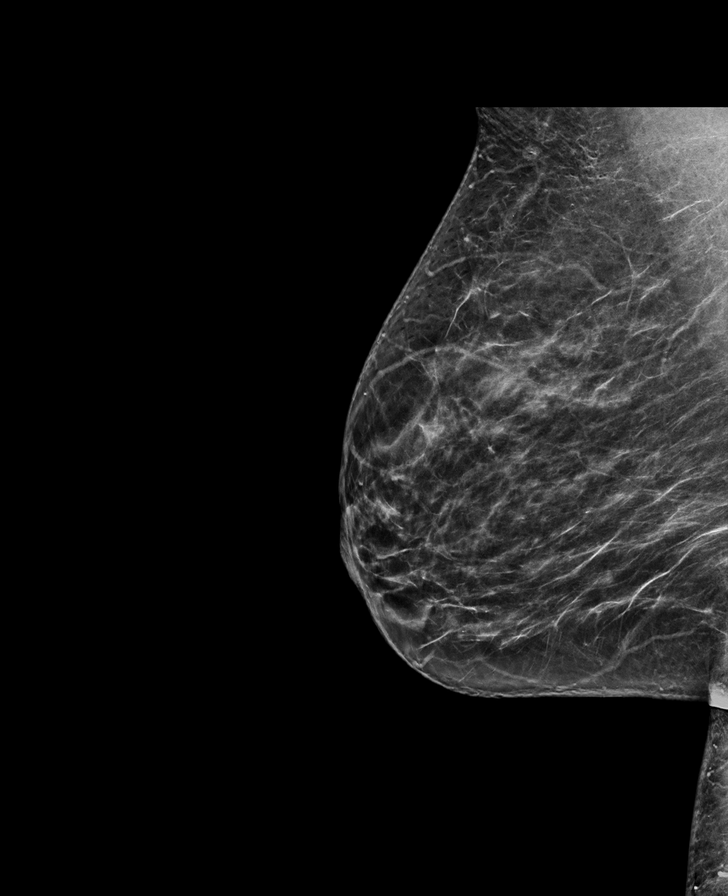

[R CC synth-2D]
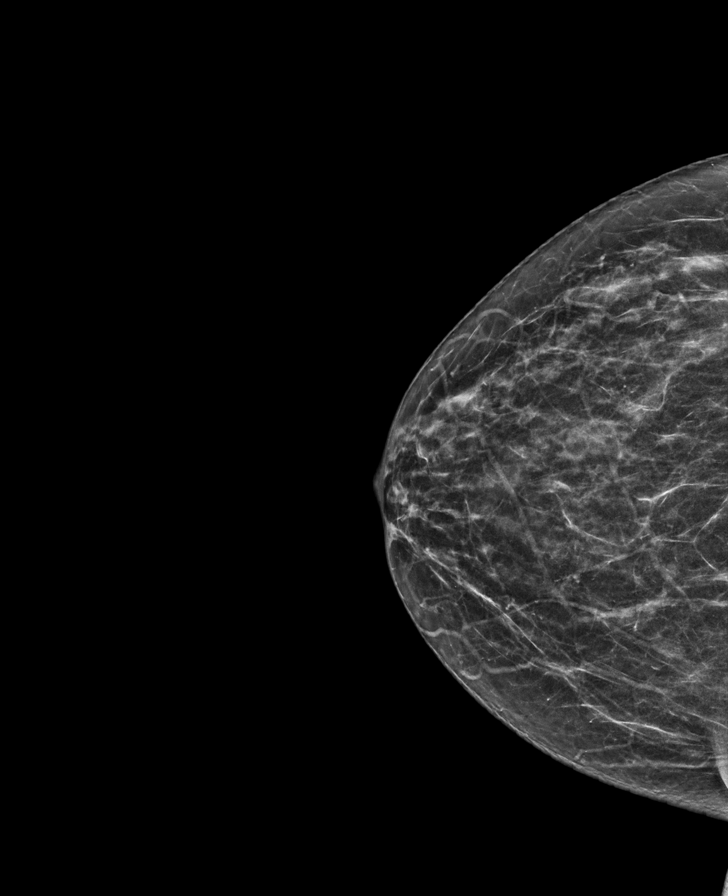

[R CC tomo · tomo slice 29/56.0]
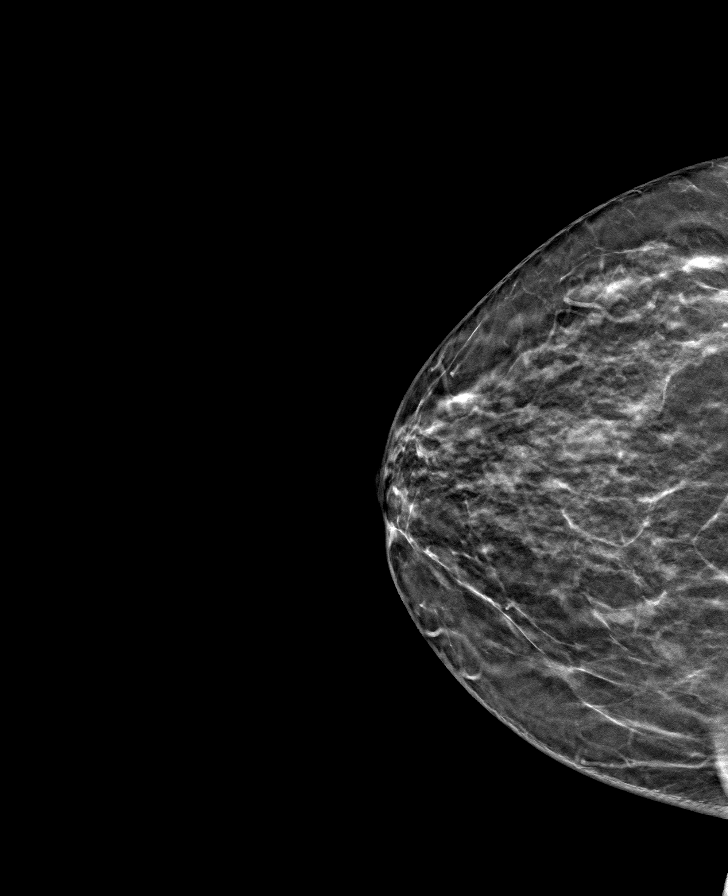

[L CC tomo · tomo slice 29/57.0]
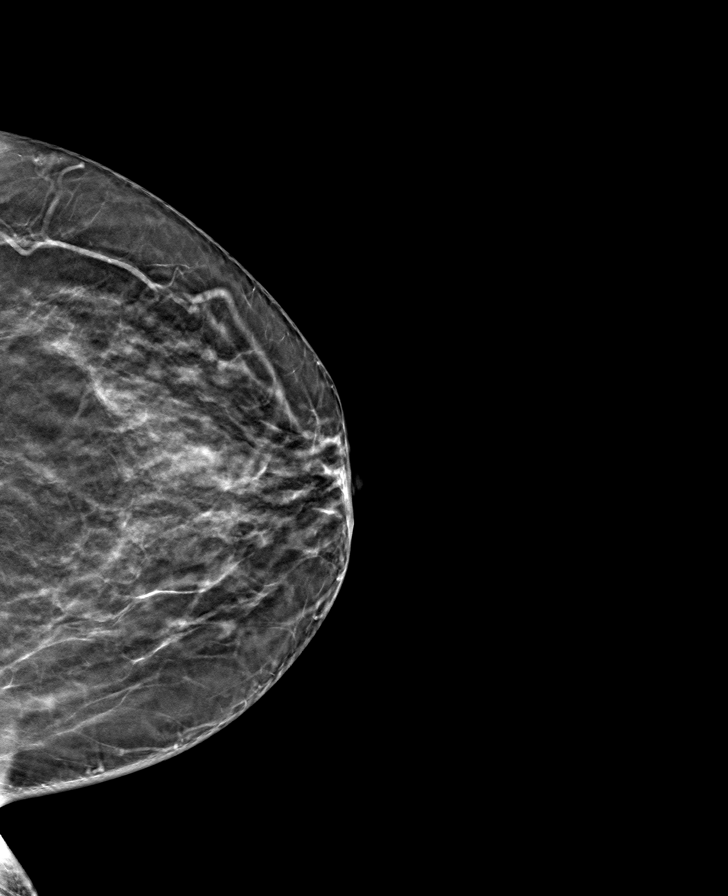

[L MLO tomo · tomo slice 35/68.0]
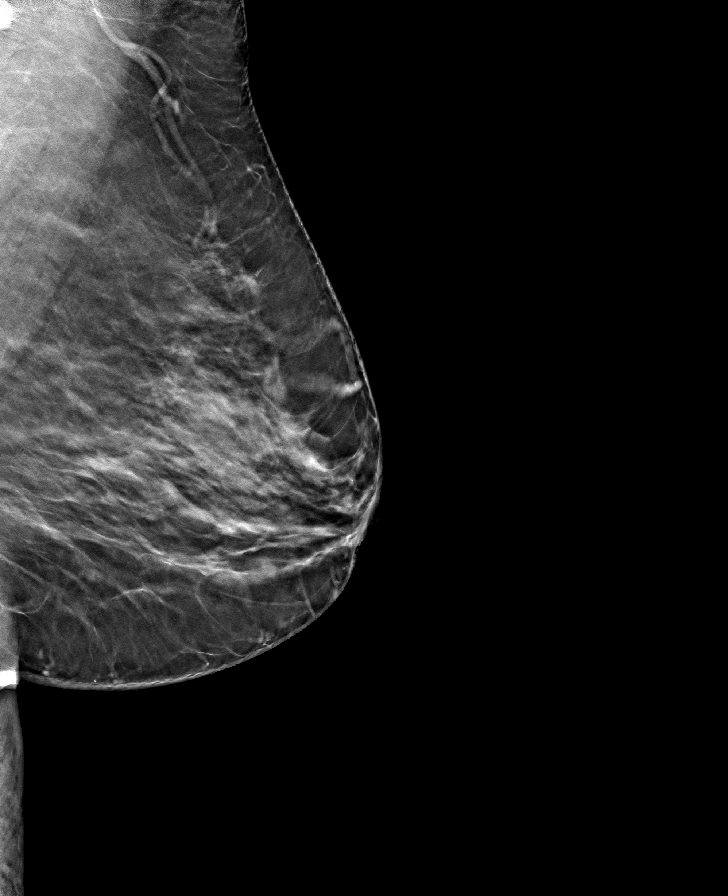

[R MLO tomo · tomo slice 34/67.0]
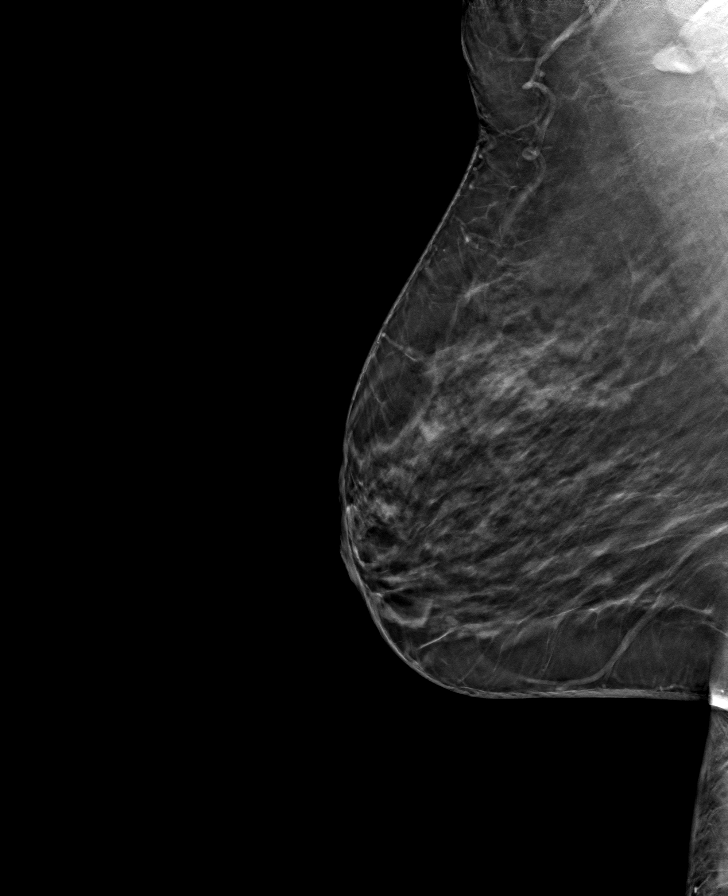

[8 of 24 positions shown; findings below may reference images not displayed]

ACR Breast Density Category b: There are scattered areas of
fibroglandular density.
FINDINGS: There are no findings suspicious for malignancy.
IMPRESSION: No mammographic evidence of malignancy. A result letter of this
screening mammogram will be mailed directly to the patient.

RECOMMENDATION:
Screening mammogram in one year. (Code:[BY])

BI-RADS CATEGORY  1: Negative.

## 2021-08-30 DIAGNOSIS — R053 Chronic cough: Secondary | ICD-10-CM | POA: Diagnosis not present

## 2021-10-05 DIAGNOSIS — Z113 Encounter for screening for infections with a predominantly sexual mode of transmission: Secondary | ICD-10-CM | POA: Diagnosis not present

## 2021-10-05 DIAGNOSIS — R413 Other amnesia: Secondary | ICD-10-CM | POA: Diagnosis not present

## 2021-10-05 DIAGNOSIS — E569 Vitamin deficiency, unspecified: Secondary | ICD-10-CM | POA: Diagnosis not present

## 2021-10-05 DIAGNOSIS — G5792 Unspecified mononeuropathy of left lower limb: Secondary | ICD-10-CM | POA: Diagnosis not present

## 2021-10-06 ENCOUNTER — Other Ambulatory Visit: Payer: Self-pay | Admitting: Neurology

## 2021-10-06 DIAGNOSIS — R413 Other amnesia: Secondary | ICD-10-CM

## 2021-10-15 ENCOUNTER — Ambulatory Visit: Payer: Medicare HMO

## 2021-11-01 DIAGNOSIS — K219 Gastro-esophageal reflux disease without esophagitis: Secondary | ICD-10-CM | POA: Diagnosis not present

## 2021-11-01 DIAGNOSIS — R0989 Other specified symptoms and signs involving the circulatory and respiratory systems: Secondary | ICD-10-CM | POA: Diagnosis not present

## 2021-11-01 DIAGNOSIS — K449 Diaphragmatic hernia without obstruction or gangrene: Secondary | ICD-10-CM | POA: Diagnosis not present

## 2021-11-01 DIAGNOSIS — R053 Chronic cough: Secondary | ICD-10-CM | POA: Diagnosis not present

## 2021-11-08 DIAGNOSIS — Z0100 Encounter for examination of eyes and vision without abnormal findings: Secondary | ICD-10-CM | POA: Diagnosis not present

## 2021-11-08 DIAGNOSIS — H40003 Preglaucoma, unspecified, bilateral: Secondary | ICD-10-CM | POA: Diagnosis not present

## 2021-11-10 ENCOUNTER — Ambulatory Visit
Admission: RE | Admit: 2021-11-10 | Discharge: 2021-11-10 | Disposition: A | Discharge status: Home / Self Care | Payer: Medicare HMO | Source: Ambulatory Visit | Attending: Neurology | Admitting: Neurology

## 2021-11-10 DIAGNOSIS — R413 Other amnesia: Secondary | ICD-10-CM | POA: Diagnosis not present

## 2021-11-10 IMAGING — MR MR HEAD W/O CM
12 series · 47 of 48 positions shown · non-contrast
Comparison: No pertinent prior exams available for comparison.

CLINICAL DATA: Provided history: Memory difficulty. Additional
history provided by scanning technologist: Short-term memory loss.

EXAM:
MRI HEAD WITHOUT CONTRAST
TECHNIQUE: Multiplanar, multiecho pulse sequences of the brain and surrounding
structures were obtained without intravenous contrast.

[Series 5: ax dwi_tracew · axial · 3.0mm · 0.65mm/px · z∈[-88,+66]mm · 5 of 48 slices shown]
[im 1/48]
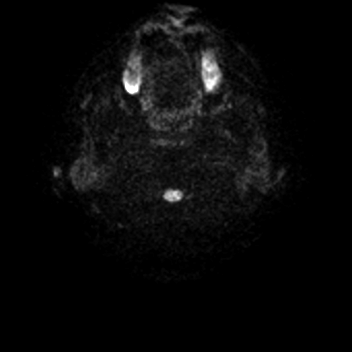
[im 12/48]
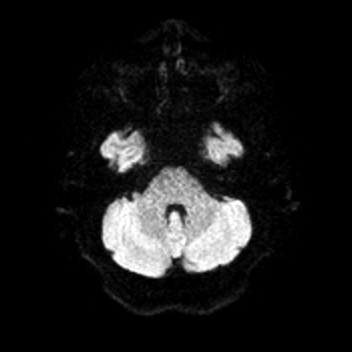
[im 24/48]
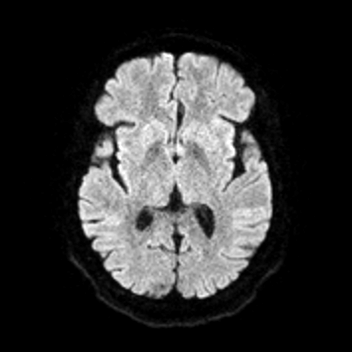
[im 36/48]
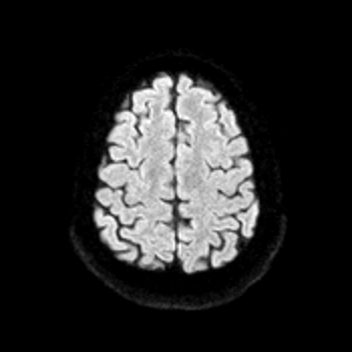
[im 48/48]
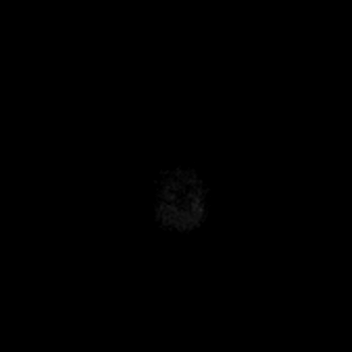

[Series 6: ax dwi_adc · axial · 3.0mm · 0.65mm/px · z∈[-88,+56]mm · 3 of 45 slices shown]
[im 1/45]
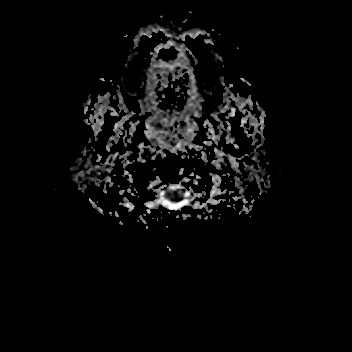
[im 23/45]
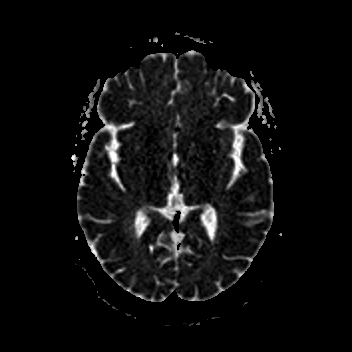
[im 45/45]
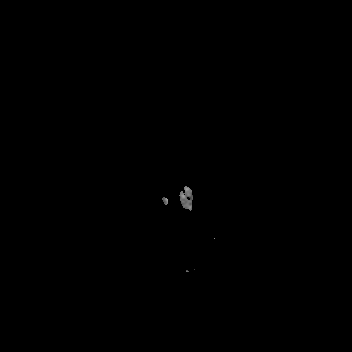

[Series 7: cor dwi_tracew · coronal · 5.0mm · 0.65mm/px · 3 of 40 slices shown]
[im 1/40]
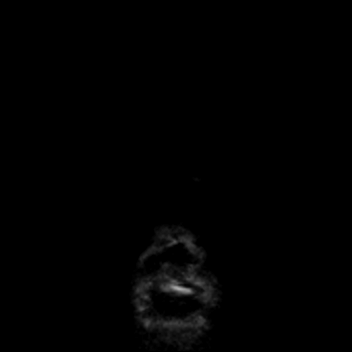
[im 20/40]
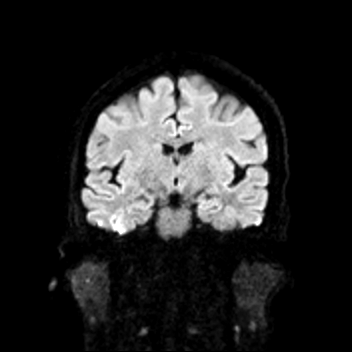
[im 40/40]
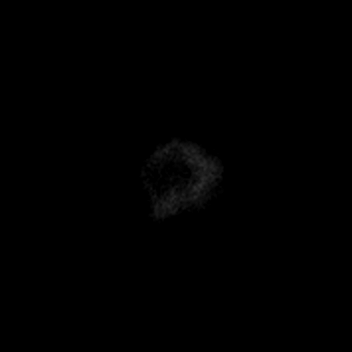

[Series 8: cor dwi_adc · coronal · 5.0mm · 0.65mm/px · 3 of 39 slices shown]
[im 1/39]
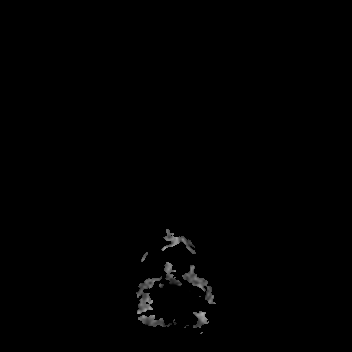
[im 20/39]
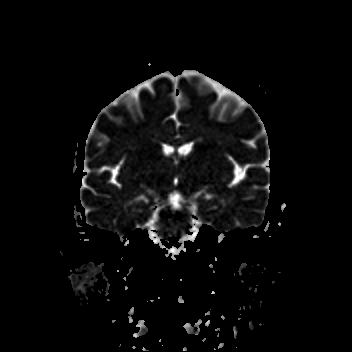
[im 39/39]
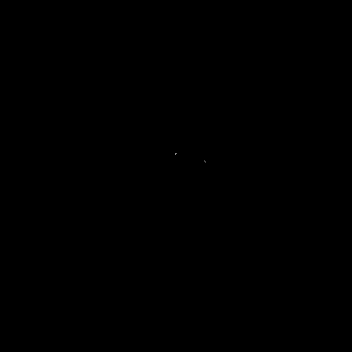

[Series 9: T1 · sagittal · 5.0mm · 0.62mm/px · 2 of 23 slices shown (1 of 2)]
[im 1/23]
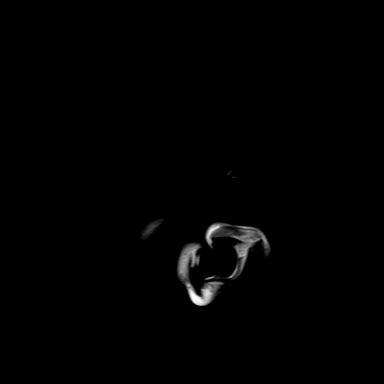
[im 23/23]
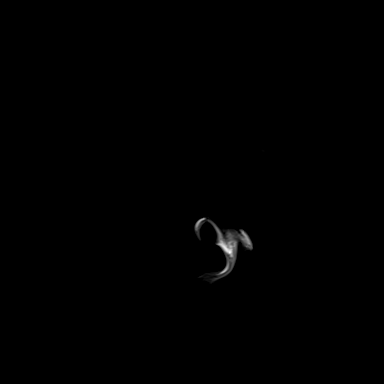

[Series 10: T2 · axial · 5.0mm · 0.53mm/px · z∈[-85,+64]mm · 2 of 26 slices shown (1 of 2)]
[im 1/26]
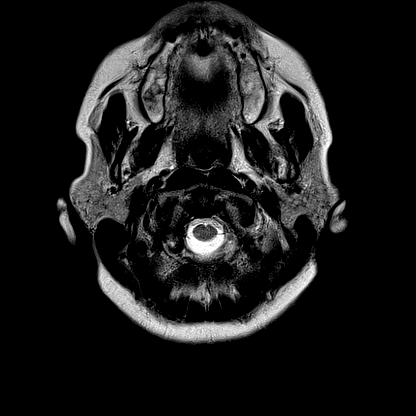
[im 26/26]
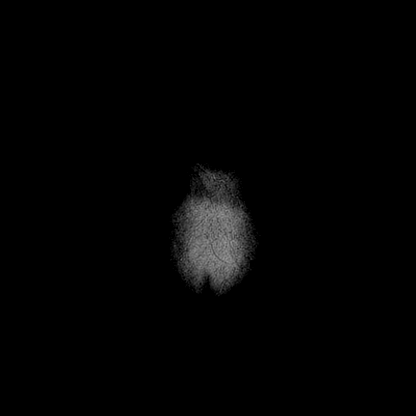

[Series 11: mag_images · axial · 3.0mm · 0.90mm/px · z∈[-99,+77]mm · 4 of 60 slices shown]
[im 1/60]
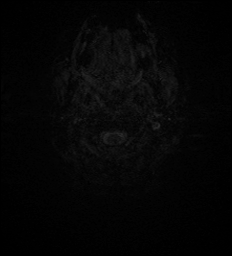
[im 20/60]
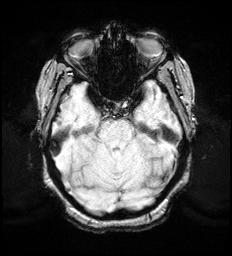
[im 40/60]
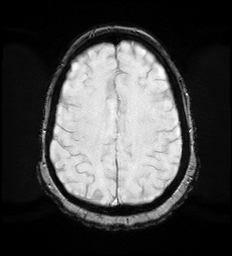
[im 60/60]
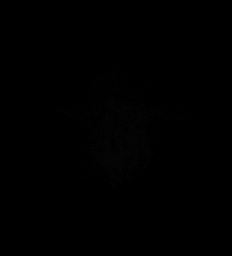

[Series 12: pha_images · axial · 3.0mm · 0.90mm/px · z∈[-99,+77]mm · 4 of 60 slices shown]
[im 1/60]
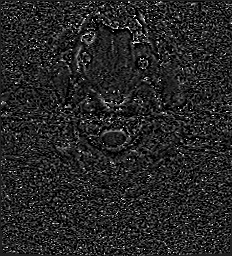
[im 20/60]
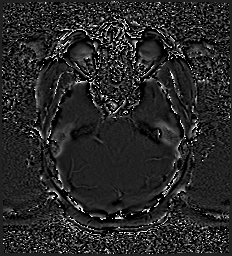
[im 40/60]
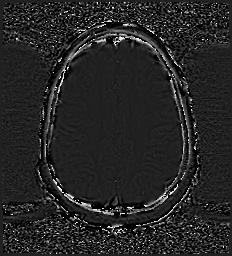
[im 60/60]
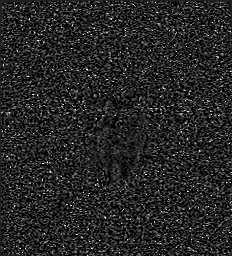

[Series 13: swi_images · axial · 3.0mm · 0.90mm/px · z∈[-99,+77]mm · 4 of 60 slices shown]
[im 1/60]
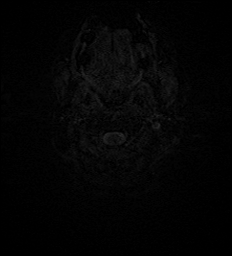
[im 20/60]
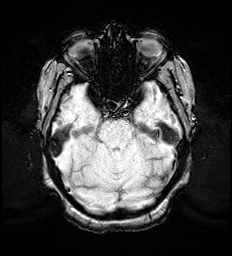
[im 40/60]
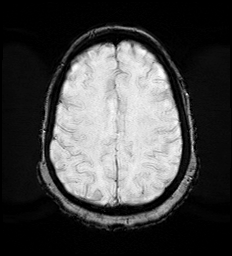
[im 60/60]
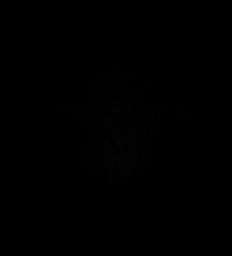

[Series 15: FLAIR · axial · 3.0mm · 0.53mm/px · z∈[-91,+70]mm · 4 of 55 slices shown]
[im 1/55]
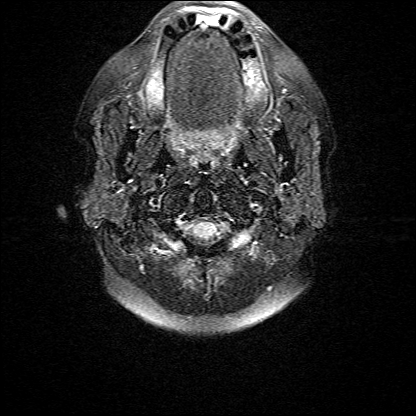
[im 19/55]
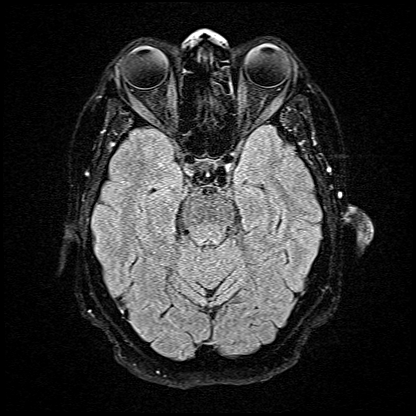
[im 37/55]
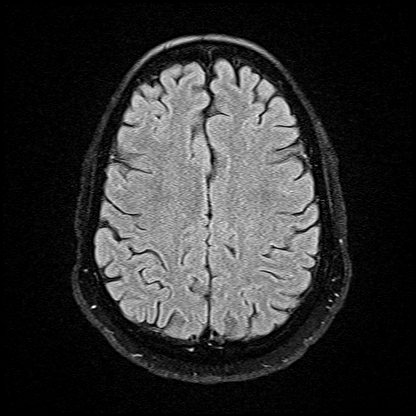
[im 55/55]
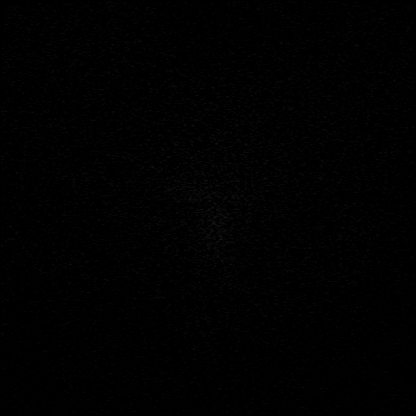

[Series 16: T1 · axial · 1.0mm · 0.98mm/px · z∈[-91,+67]mm · 11 of 160 slices shown (2 of 2)]
[im 1/160]
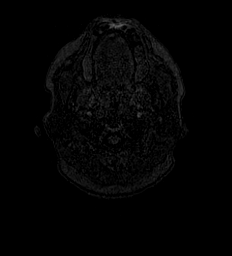
[im 15/160]
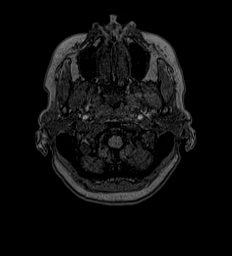
[im 29/160]
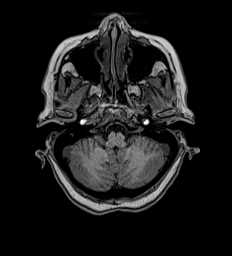
[im 44/160]
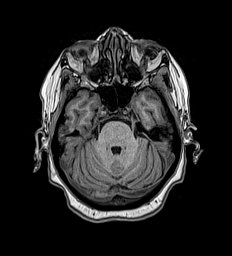
[im 58/160]
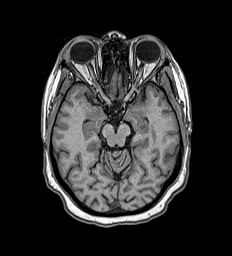
[im 73/160]
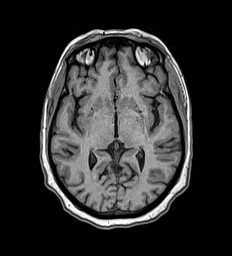
[im 87/160]
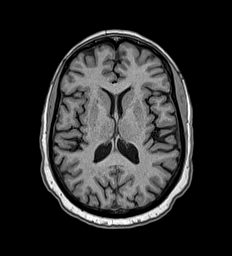
[im 102/160]
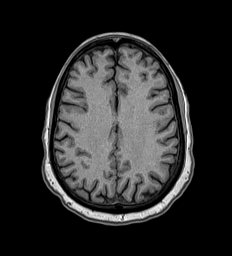
[im 116/160]
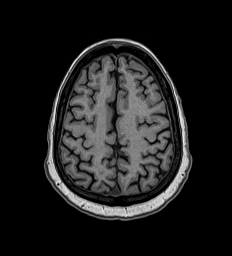
[im 131/160]
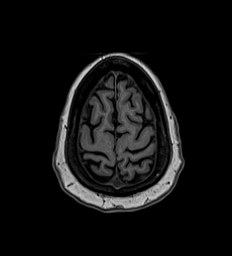
[im 160/160]
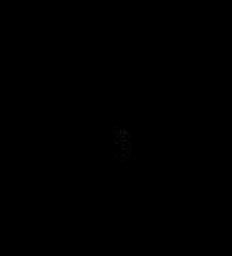

[Series 17: T2 · coronal · 5.0mm · 0.57mm/px · 2 of 29 slices shown (2 of 2)]
[im 1/29]
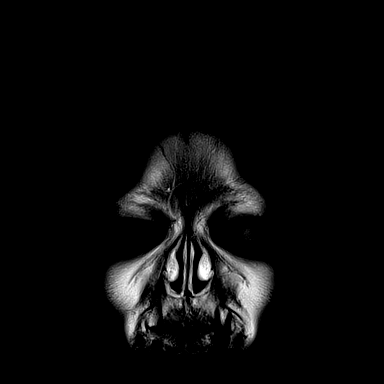
[im 29/29]
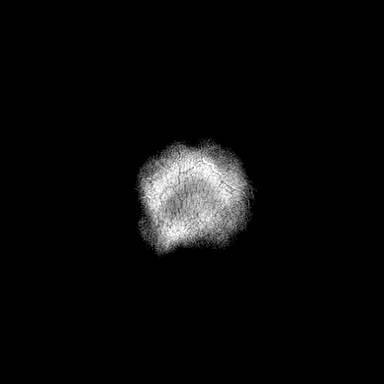

[47 of 48 positions shown; findings below may reference images not displayed]

FINDINGS: Brain:

Cerebral volume is normal for age.

Multifocal T2 FLAIR hyperintense signal abnormality within the
cerebral white matter, nonspecific but compatible with mild chronic
small vessel ischemic disease.

There is no acute infarct.

No evidence of an intracranial mass.

No chronic intracranial blood products.

No extra-axial fluid collection.

No midline shift.

Vascular: Maintained flow voids within the proximal large arterial
vessels.

Skull and upper cervical spine: No focal suspicious marrow lesion

Sinuses/Orbits: No mass or acute finding within the imaged orbits.
Minimal mucosal thickening within the bilateral ethmoid sinuses.
IMPRESSION: No evidence of acute intracranial abnormality.

Mild chronic small vessel ischemic changes within the cerebral white
matter.

No age advanced or lobar predominant parenchymal atrophy.

Minimal mucosal thickening within the bilateral ethmoid sinuses.

## 2021-11-11 DIAGNOSIS — E1169 Type 2 diabetes mellitus with other specified complication: Secondary | ICD-10-CM | POA: Diagnosis not present

## 2021-11-11 DIAGNOSIS — M47812 Spondylosis without myelopathy or radiculopathy, cervical region: Secondary | ICD-10-CM | POA: Diagnosis not present

## 2021-11-11 DIAGNOSIS — M5412 Radiculopathy, cervical region: Secondary | ICD-10-CM | POA: Diagnosis not present

## 2021-11-11 DIAGNOSIS — E785 Hyperlipidemia, unspecified: Secondary | ICD-10-CM | POA: Diagnosis not present

## 2021-11-17 DIAGNOSIS — K2950 Unspecified chronic gastritis without bleeding: Secondary | ICD-10-CM | POA: Diagnosis not present

## 2021-11-17 DIAGNOSIS — K449 Diaphragmatic hernia without obstruction or gangrene: Secondary | ICD-10-CM | POA: Diagnosis not present

## 2021-11-17 DIAGNOSIS — K219 Gastro-esophageal reflux disease without esophagitis: Secondary | ICD-10-CM | POA: Diagnosis not present

## 2021-11-25 DIAGNOSIS — I10 Essential (primary) hypertension: Secondary | ICD-10-CM | POA: Diagnosis not present

## 2021-11-25 DIAGNOSIS — D72819 Decreased white blood cell count, unspecified: Secondary | ICD-10-CM | POA: Diagnosis not present

## 2021-11-25 DIAGNOSIS — E1169 Type 2 diabetes mellitus with other specified complication: Secondary | ICD-10-CM | POA: Diagnosis not present

## 2021-11-25 DIAGNOSIS — E7800 Pure hypercholesterolemia, unspecified: Secondary | ICD-10-CM | POA: Diagnosis not present

## 2021-11-25 DIAGNOSIS — Z862 Personal history of diseases of the blood and blood-forming organs and certain disorders involving the immune mechanism: Secondary | ICD-10-CM | POA: Diagnosis not present

## 2021-11-25 DIAGNOSIS — E785 Hyperlipidemia, unspecified: Secondary | ICD-10-CM | POA: Diagnosis not present

## 2021-12-02 DIAGNOSIS — E785 Hyperlipidemia, unspecified: Secondary | ICD-10-CM | POA: Diagnosis not present

## 2021-12-02 DIAGNOSIS — E119 Type 2 diabetes mellitus without complications: Secondary | ICD-10-CM | POA: Diagnosis not present

## 2021-12-02 DIAGNOSIS — Z0000 Encounter for general adult medical examination without abnormal findings: Secondary | ICD-10-CM | POA: Diagnosis not present

## 2021-12-02 DIAGNOSIS — Z1389 Encounter for screening for other disorder: Secondary | ICD-10-CM | POA: Diagnosis not present

## 2022-01-14 DIAGNOSIS — H40003 Preglaucoma, unspecified, bilateral: Secondary | ICD-10-CM | POA: Diagnosis not present

## 2022-02-01 DIAGNOSIS — R1314 Dysphagia, pharyngoesophageal phase: Secondary | ICD-10-CM | POA: Diagnosis not present

## 2022-02-01 DIAGNOSIS — J301 Allergic rhinitis due to pollen: Secondary | ICD-10-CM | POA: Diagnosis not present

## 2022-02-01 DIAGNOSIS — R059 Cough, unspecified: Secondary | ICD-10-CM | POA: Diagnosis not present

## 2022-03-23 DIAGNOSIS — F5101 Primary insomnia: Secondary | ICD-10-CM | POA: Diagnosis not present

## 2022-03-23 DIAGNOSIS — R9082 White matter disease, unspecified: Secondary | ICD-10-CM | POA: Diagnosis not present

## 2022-03-23 DIAGNOSIS — R413 Other amnesia: Secondary | ICD-10-CM | POA: Diagnosis not present

## 2022-03-23 DIAGNOSIS — G5792 Unspecified mononeuropathy of left lower limb: Secondary | ICD-10-CM | POA: Diagnosis not present

## 2022-03-23 DIAGNOSIS — R350 Frequency of micturition: Secondary | ICD-10-CM | POA: Diagnosis not present

## 2022-04-06 ENCOUNTER — Encounter: Payer: Medicare HMO | Admitting: Podiatry

## 2022-04-06 ENCOUNTER — Ambulatory Visit: Payer: Medicare HMO

## 2022-04-13 NOTE — Progress Notes (Signed)
This encounter was created in error - please disregard.

## 2022-05-19 DIAGNOSIS — M7742 Metatarsalgia, left foot: Secondary | ICD-10-CM | POA: Diagnosis not present

## 2022-05-19 DIAGNOSIS — M5416 Radiculopathy, lumbar region: Secondary | ICD-10-CM | POA: Diagnosis not present

## 2022-05-19 DIAGNOSIS — E1142 Type 2 diabetes mellitus with diabetic polyneuropathy: Secondary | ICD-10-CM | POA: Diagnosis not present

## 2022-05-19 DIAGNOSIS — M79671 Pain in right foot: Secondary | ICD-10-CM | POA: Diagnosis not present

## 2022-05-31 DIAGNOSIS — I10 Essential (primary) hypertension: Secondary | ICD-10-CM | POA: Diagnosis not present

## 2022-05-31 DIAGNOSIS — E785 Hyperlipidemia, unspecified: Secondary | ICD-10-CM | POA: Diagnosis not present

## 2022-05-31 DIAGNOSIS — E1169 Type 2 diabetes mellitus with other specified complication: Secondary | ICD-10-CM | POA: Diagnosis not present

## 2022-05-31 DIAGNOSIS — E7800 Pure hypercholesterolemia, unspecified: Secondary | ICD-10-CM | POA: Diagnosis not present

## 2022-06-13 DIAGNOSIS — M25561 Pain in right knee: Secondary | ICD-10-CM | POA: Diagnosis not present

## 2022-06-13 DIAGNOSIS — E6609 Other obesity due to excess calories: Secondary | ICD-10-CM | POA: Diagnosis not present

## 2022-06-13 DIAGNOSIS — N951 Menopausal and female climacteric states: Secondary | ICD-10-CM | POA: Diagnosis not present

## 2022-06-13 DIAGNOSIS — E7800 Pure hypercholesterolemia, unspecified: Secondary | ICD-10-CM | POA: Diagnosis not present

## 2022-06-13 DIAGNOSIS — Z6830 Body mass index (BMI) 30.0-30.9, adult: Secondary | ICD-10-CM | POA: Diagnosis not present

## 2022-06-13 DIAGNOSIS — I10 Essential (primary) hypertension: Secondary | ICD-10-CM | POA: Diagnosis not present

## 2022-06-13 DIAGNOSIS — E1169 Type 2 diabetes mellitus with other specified complication: Secondary | ICD-10-CM | POA: Diagnosis not present

## 2022-06-14 DIAGNOSIS — H43813 Vitreous degeneration, bilateral: Secondary | ICD-10-CM | POA: Diagnosis not present

## 2022-06-14 DIAGNOSIS — H40003 Preglaucoma, unspecified, bilateral: Secondary | ICD-10-CM | POA: Diagnosis not present

## 2022-06-14 DIAGNOSIS — H2513 Age-related nuclear cataract, bilateral: Secondary | ICD-10-CM | POA: Diagnosis not present

## 2022-06-14 DIAGNOSIS — E119 Type 2 diabetes mellitus without complications: Secondary | ICD-10-CM | POA: Diagnosis not present

## 2022-07-13 ENCOUNTER — Other Ambulatory Visit: Payer: Self-pay | Admitting: Family Medicine

## 2022-07-13 DIAGNOSIS — Z1231 Encounter for screening mammogram for malignant neoplasm of breast: Secondary | ICD-10-CM

## 2022-07-18 DIAGNOSIS — R61 Generalized hyperhidrosis: Secondary | ICD-10-CM | POA: Diagnosis not present

## 2022-07-18 DIAGNOSIS — R232 Flushing: Secondary | ICD-10-CM | POA: Diagnosis not present

## 2022-07-18 DIAGNOSIS — Z01411 Encounter for gynecological examination (general) (routine) with abnormal findings: Secondary | ICD-10-CM | POA: Diagnosis not present

## 2022-07-18 DIAGNOSIS — Z124 Encounter for screening for malignant neoplasm of cervix: Secondary | ICD-10-CM | POA: Diagnosis not present

## 2022-08-05 DIAGNOSIS — R9082 White matter disease, unspecified: Secondary | ICD-10-CM | POA: Diagnosis not present

## 2022-08-05 DIAGNOSIS — G5792 Unspecified mononeuropathy of left lower limb: Secondary | ICD-10-CM | POA: Diagnosis not present

## 2022-08-08 DIAGNOSIS — M4726 Other spondylosis with radiculopathy, lumbar region: Secondary | ICD-10-CM | POA: Diagnosis not present

## 2022-08-08 DIAGNOSIS — M5416 Radiculopathy, lumbar region: Secondary | ICD-10-CM | POA: Diagnosis not present

## 2022-08-11 ENCOUNTER — Ambulatory Visit
Admission: RE | Admit: 2022-08-11 | Discharge: 2022-08-11 | Disposition: A | Discharge status: Home / Self Care | Payer: Medicare HMO | Source: Ambulatory Visit | Attending: Family Medicine | Admitting: Family Medicine

## 2022-08-11 DIAGNOSIS — Z1231 Encounter for screening mammogram for malignant neoplasm of breast: Secondary | ICD-10-CM | POA: Insufficient documentation

## 2022-08-12 ENCOUNTER — Other Ambulatory Visit: Payer: Self-pay | Admitting: Neurosurgery

## 2022-08-12 DIAGNOSIS — M4726 Other spondylosis with radiculopathy, lumbar region: Secondary | ICD-10-CM

## 2022-08-16 DIAGNOSIS — S99921A Unspecified injury of right foot, initial encounter: Secondary | ICD-10-CM | POA: Diagnosis not present

## 2022-08-29 ENCOUNTER — Ambulatory Visit
Admission: RE | Admit: 2022-08-29 | Discharge: 2022-08-29 | Disposition: A | Discharge status: Home / Self Care | Payer: Medicare HMO | Source: Ambulatory Visit | Attending: Neurosurgery | Admitting: Neurosurgery

## 2022-08-29 DIAGNOSIS — R200 Anesthesia of skin: Secondary | ICD-10-CM | POA: Diagnosis not present

## 2022-08-29 DIAGNOSIS — M4726 Other spondylosis with radiculopathy, lumbar region: Secondary | ICD-10-CM

## 2022-08-29 DIAGNOSIS — M545 Low back pain, unspecified: Secondary | ICD-10-CM | POA: Diagnosis not present

## 2022-08-30 DIAGNOSIS — L600 Ingrowing nail: Secondary | ICD-10-CM | POA: Diagnosis not present

## 2022-09-02 DIAGNOSIS — E7800 Pure hypercholesterolemia, unspecified: Secondary | ICD-10-CM | POA: Diagnosis not present

## 2022-09-02 DIAGNOSIS — E1169 Type 2 diabetes mellitus with other specified complication: Secondary | ICD-10-CM | POA: Diagnosis not present

## 2022-09-02 DIAGNOSIS — E785 Hyperlipidemia, unspecified: Secondary | ICD-10-CM | POA: Diagnosis not present

## 2022-09-03 ENCOUNTER — Other Ambulatory Visit: Payer: Medicare HMO

## 2022-09-06 DIAGNOSIS — Z6831 Body mass index (BMI) 31.0-31.9, adult: Secondary | ICD-10-CM | POA: Diagnosis not present

## 2022-09-06 DIAGNOSIS — M4726 Other spondylosis with radiculopathy, lumbar region: Secondary | ICD-10-CM | POA: Diagnosis not present

## 2022-09-06 DIAGNOSIS — M5416 Radiculopathy, lumbar region: Secondary | ICD-10-CM | POA: Diagnosis not present

## 2022-09-09 DIAGNOSIS — E1169 Type 2 diabetes mellitus with other specified complication: Secondary | ICD-10-CM | POA: Diagnosis not present

## 2022-09-09 DIAGNOSIS — E7800 Pure hypercholesterolemia, unspecified: Secondary | ICD-10-CM | POA: Diagnosis not present

## 2022-09-09 DIAGNOSIS — I10 Essential (primary) hypertension: Secondary | ICD-10-CM | POA: Diagnosis not present

## 2022-09-09 DIAGNOSIS — E785 Hyperlipidemia, unspecified: Secondary | ICD-10-CM | POA: Diagnosis not present

## 2022-09-09 DIAGNOSIS — Z6831 Body mass index (BMI) 31.0-31.9, adult: Secondary | ICD-10-CM | POA: Diagnosis not present

## 2022-09-09 DIAGNOSIS — M5416 Radiculopathy, lumbar region: Secondary | ICD-10-CM | POA: Diagnosis not present

## 2022-09-09 DIAGNOSIS — E6609 Other obesity due to excess calories: Secondary | ICD-10-CM | POA: Diagnosis not present

## 2022-09-27 DIAGNOSIS — S39012D Strain of muscle, fascia and tendon of lower back, subsequent encounter: Secondary | ICD-10-CM | POA: Diagnosis not present

## 2022-09-27 DIAGNOSIS — G8929 Other chronic pain: Secondary | ICD-10-CM | POA: Diagnosis not present

## 2022-10-18 DIAGNOSIS — M76811 Anterior tibial syndrome, right leg: Secondary | ICD-10-CM | POA: Diagnosis not present

## 2022-10-18 DIAGNOSIS — M2141 Flat foot [pes planus] (acquired), right foot: Secondary | ICD-10-CM | POA: Diagnosis not present

## 2022-10-27 DIAGNOSIS — M545 Low back pain, unspecified: Secondary | ICD-10-CM | POA: Diagnosis not present

## 2022-10-27 DIAGNOSIS — M25649 Stiffness of unspecified hand, not elsewhere classified: Secondary | ICD-10-CM | POA: Diagnosis not present

## 2022-10-27 DIAGNOSIS — G8929 Other chronic pain: Secondary | ICD-10-CM | POA: Diagnosis not present

## 2022-10-31 DIAGNOSIS — G8929 Other chronic pain: Secondary | ICD-10-CM | POA: Diagnosis not present

## 2022-10-31 DIAGNOSIS — M545 Low back pain, unspecified: Secondary | ICD-10-CM | POA: Diagnosis not present

## 2022-11-08 DIAGNOSIS — M545 Low back pain, unspecified: Secondary | ICD-10-CM | POA: Diagnosis not present

## 2022-11-08 DIAGNOSIS — M25649 Stiffness of unspecified hand, not elsewhere classified: Secondary | ICD-10-CM | POA: Diagnosis not present

## 2022-11-08 DIAGNOSIS — G8929 Other chronic pain: Secondary | ICD-10-CM | POA: Diagnosis not present

## 2022-12-06 DIAGNOSIS — S39012D Strain of muscle, fascia and tendon of lower back, subsequent encounter: Secondary | ICD-10-CM | POA: Diagnosis not present

## 2022-12-06 DIAGNOSIS — G8929 Other chronic pain: Secondary | ICD-10-CM | POA: Diagnosis not present

## 2022-12-06 DIAGNOSIS — M25649 Stiffness of unspecified hand, not elsewhere classified: Secondary | ICD-10-CM | POA: Diagnosis not present

## 2022-12-06 DIAGNOSIS — M5416 Radiculopathy, lumbar region: Secondary | ICD-10-CM | POA: Diagnosis not present

## 2022-12-22 DIAGNOSIS — E785 Hyperlipidemia, unspecified: Secondary | ICD-10-CM | POA: Diagnosis not present

## 2022-12-22 DIAGNOSIS — I10 Essential (primary) hypertension: Secondary | ICD-10-CM | POA: Diagnosis not present

## 2022-12-22 DIAGNOSIS — E7800 Pure hypercholesterolemia, unspecified: Secondary | ICD-10-CM | POA: Diagnosis not present

## 2022-12-22 DIAGNOSIS — E1169 Type 2 diabetes mellitus with other specified complication: Secondary | ICD-10-CM | POA: Diagnosis not present

## 2022-12-29 DIAGNOSIS — E119 Type 2 diabetes mellitus without complications: Secondary | ICD-10-CM | POA: Diagnosis not present

## 2022-12-29 DIAGNOSIS — Z6831 Body mass index (BMI) 31.0-31.9, adult: Secondary | ICD-10-CM | POA: Diagnosis not present

## 2022-12-29 DIAGNOSIS — Z1331 Encounter for screening for depression: Secondary | ICD-10-CM | POA: Diagnosis not present

## 2022-12-29 DIAGNOSIS — E669 Obesity, unspecified: Secondary | ICD-10-CM | POA: Diagnosis not present

## 2022-12-29 DIAGNOSIS — E785 Hyperlipidemia, unspecified: Secondary | ICD-10-CM | POA: Diagnosis not present

## 2022-12-29 DIAGNOSIS — Z0000 Encounter for general adult medical examination without abnormal findings: Secondary | ICD-10-CM | POA: Diagnosis not present

## 2022-12-29 DIAGNOSIS — I10 Essential (primary) hypertension: Secondary | ICD-10-CM | POA: Diagnosis not present

## 2023-01-02 DIAGNOSIS — H40003 Preglaucoma, unspecified, bilateral: Secondary | ICD-10-CM | POA: Diagnosis not present

## 2023-01-05 DIAGNOSIS — M545 Low back pain, unspecified: Secondary | ICD-10-CM | POA: Diagnosis not present

## 2023-01-05 DIAGNOSIS — G8929 Other chronic pain: Secondary | ICD-10-CM | POA: Diagnosis not present

## 2023-01-05 DIAGNOSIS — M5416 Radiculopathy, lumbar region: Secondary | ICD-10-CM | POA: Diagnosis not present

## 2023-01-05 DIAGNOSIS — M25562 Pain in left knee: Secondary | ICD-10-CM | POA: Diagnosis not present

## 2023-01-10 DIAGNOSIS — H40003 Preglaucoma, unspecified, bilateral: Secondary | ICD-10-CM | POA: Diagnosis not present

## 2023-01-10 DIAGNOSIS — Z0100 Encounter for examination of eyes and vision without abnormal findings: Secondary | ICD-10-CM | POA: Diagnosis not present

## 2023-01-10 DIAGNOSIS — E119 Type 2 diabetes mellitus without complications: Secondary | ICD-10-CM | POA: Diagnosis not present

## 2023-01-10 DIAGNOSIS — H2513 Age-related nuclear cataract, bilateral: Secondary | ICD-10-CM | POA: Diagnosis not present

## 2023-01-10 DIAGNOSIS — H43813 Vitreous degeneration, bilateral: Secondary | ICD-10-CM | POA: Diagnosis not present

## 2023-01-31 DIAGNOSIS — M25562 Pain in left knee: Secondary | ICD-10-CM | POA: Diagnosis not present

## 2023-01-31 DIAGNOSIS — M5441 Lumbago with sciatica, right side: Secondary | ICD-10-CM | POA: Diagnosis not present

## 2023-01-31 DIAGNOSIS — G8929 Other chronic pain: Secondary | ICD-10-CM | POA: Diagnosis not present

## 2023-01-31 DIAGNOSIS — M5442 Lumbago with sciatica, left side: Secondary | ICD-10-CM | POA: Diagnosis not present

## 2023-02-13 DIAGNOSIS — M1712 Unilateral primary osteoarthritis, left knee: Secondary | ICD-10-CM | POA: Diagnosis not present

## 2023-02-13 DIAGNOSIS — G8929 Other chronic pain: Secondary | ICD-10-CM | POA: Diagnosis not present

## 2023-02-13 DIAGNOSIS — M25562 Pain in left knee: Secondary | ICD-10-CM | POA: Diagnosis not present

## 2023-02-14 DIAGNOSIS — M5442 Lumbago with sciatica, left side: Secondary | ICD-10-CM | POA: Diagnosis not present

## 2023-02-14 DIAGNOSIS — G8929 Other chronic pain: Secondary | ICD-10-CM | POA: Diagnosis not present

## 2023-02-14 DIAGNOSIS — M5441 Lumbago with sciatica, right side: Secondary | ICD-10-CM | POA: Diagnosis not present

## 2023-02-14 DIAGNOSIS — M25562 Pain in left knee: Secondary | ICD-10-CM | POA: Diagnosis not present

## 2023-02-20 DIAGNOSIS — M216X2 Other acquired deformities of left foot: Secondary | ICD-10-CM | POA: Diagnosis not present

## 2023-02-20 DIAGNOSIS — M722 Plantar fascial fibromatosis: Secondary | ICD-10-CM | POA: Diagnosis not present

## 2023-02-20 DIAGNOSIS — L603 Nail dystrophy: Secondary | ICD-10-CM | POA: Diagnosis not present

## 2023-04-25 DIAGNOSIS — M79644 Pain in right finger(s): Secondary | ICD-10-CM | POA: Diagnosis not present

## 2023-05-18 DIAGNOSIS — L299 Pruritus, unspecified: Secondary | ICD-10-CM | POA: Diagnosis not present

## 2023-05-18 DIAGNOSIS — R1032 Left lower quadrant pain: Secondary | ICD-10-CM | POA: Diagnosis not present

## 2023-05-18 DIAGNOSIS — M5416 Radiculopathy, lumbar region: Secondary | ICD-10-CM | POA: Diagnosis not present

## 2023-05-18 DIAGNOSIS — R109 Unspecified abdominal pain: Secondary | ICD-10-CM | POA: Diagnosis not present

## 2023-05-18 DIAGNOSIS — K5900 Constipation, unspecified: Secondary | ICD-10-CM | POA: Diagnosis not present

## 2023-05-18 DIAGNOSIS — M65311 Trigger thumb, right thumb: Secondary | ICD-10-CM | POA: Diagnosis not present

## 2023-05-18 DIAGNOSIS — Z1211 Encounter for screening for malignant neoplasm of colon: Secondary | ICD-10-CM | POA: Diagnosis not present

## 2023-05-30 DIAGNOSIS — M722 Plantar fascial fibromatosis: Secondary | ICD-10-CM | POA: Diagnosis not present

## 2023-06-07 DIAGNOSIS — K219 Gastro-esophageal reflux disease without esophagitis: Secondary | ICD-10-CM | POA: Diagnosis not present

## 2023-06-07 DIAGNOSIS — R194 Change in bowel habit: Secondary | ICD-10-CM | POA: Diagnosis not present

## 2023-06-07 DIAGNOSIS — K5909 Other constipation: Secondary | ICD-10-CM | POA: Diagnosis not present

## 2023-06-07 DIAGNOSIS — Z1211 Encounter for screening for malignant neoplasm of colon: Secondary | ICD-10-CM | POA: Diagnosis not present

## 2023-06-07 DIAGNOSIS — K625 Hemorrhage of anus and rectum: Secondary | ICD-10-CM | POA: Diagnosis not present

## 2023-06-07 DIAGNOSIS — R1032 Left lower quadrant pain: Secondary | ICD-10-CM | POA: Diagnosis not present

## 2023-06-29 DIAGNOSIS — E785 Hyperlipidemia, unspecified: Secondary | ICD-10-CM | POA: Diagnosis not present

## 2023-06-29 DIAGNOSIS — E1169 Type 2 diabetes mellitus with other specified complication: Secondary | ICD-10-CM | POA: Diagnosis not present

## 2023-06-29 DIAGNOSIS — E7800 Pure hypercholesterolemia, unspecified: Secondary | ICD-10-CM | POA: Diagnosis not present

## 2023-07-06 DIAGNOSIS — E785 Hyperlipidemia, unspecified: Secondary | ICD-10-CM | POA: Diagnosis not present

## 2023-07-06 DIAGNOSIS — E6609 Other obesity due to excess calories: Secondary | ICD-10-CM | POA: Diagnosis not present

## 2023-07-06 DIAGNOSIS — E66811 Obesity, class 1: Secondary | ICD-10-CM | POA: Diagnosis not present

## 2023-07-06 DIAGNOSIS — Z6831 Body mass index (BMI) 31.0-31.9, adult: Secondary | ICD-10-CM | POA: Diagnosis not present

## 2023-07-06 DIAGNOSIS — E1169 Type 2 diabetes mellitus with other specified complication: Secondary | ICD-10-CM | POA: Diagnosis not present

## 2023-07-06 DIAGNOSIS — E7800 Pure hypercholesterolemia, unspecified: Secondary | ICD-10-CM | POA: Diagnosis not present

## 2023-07-06 DIAGNOSIS — I10 Essential (primary) hypertension: Secondary | ICD-10-CM | POA: Diagnosis not present

## 2023-07-10 ENCOUNTER — Other Ambulatory Visit: Payer: Self-pay | Admitting: Family Medicine

## 2023-07-10 DIAGNOSIS — Z1231 Encounter for screening mammogram for malignant neoplasm of breast: Secondary | ICD-10-CM

## 2023-07-13 DIAGNOSIS — H2513 Age-related nuclear cataract, bilateral: Secondary | ICD-10-CM | POA: Diagnosis not present

## 2023-07-13 DIAGNOSIS — E119 Type 2 diabetes mellitus without complications: Secondary | ICD-10-CM | POA: Diagnosis not present

## 2023-07-13 DIAGNOSIS — H40003 Preglaucoma, unspecified, bilateral: Secondary | ICD-10-CM | POA: Diagnosis not present

## 2023-07-27 ENCOUNTER — Ambulatory Visit: Payer: Medicare HMO | Admitting: Anesthesiology

## 2023-07-27 ENCOUNTER — Ambulatory Visit
Admission: RE | Admit: 2023-07-27 | Discharge: 2023-07-27 | Disposition: A | Discharge status: Home / Self Care | Payer: Medicare HMO | Attending: Gastroenterology | Admitting: Gastroenterology

## 2023-07-27 ENCOUNTER — Encounter
Admission: RE | Disposition: A | Discharge status: Home / Self Care | Payer: Self-pay | Source: Home / Self Care | Attending: Gastroenterology

## 2023-07-27 ENCOUNTER — Encounter: Payer: Self-pay | Admitting: Gastroenterology

## 2023-07-27 DIAGNOSIS — Z7984 Long term (current) use of oral hypoglycemic drugs: Secondary | ICD-10-CM | POA: Insufficient documentation

## 2023-07-27 DIAGNOSIS — K921 Melena: Secondary | ICD-10-CM | POA: Insufficient documentation

## 2023-07-27 DIAGNOSIS — K5900 Constipation, unspecified: Secondary | ICD-10-CM | POA: Diagnosis not present

## 2023-07-27 DIAGNOSIS — K649 Unspecified hemorrhoids: Secondary | ICD-10-CM | POA: Diagnosis not present

## 2023-07-27 DIAGNOSIS — K640 First degree hemorrhoids: Secondary | ICD-10-CM | POA: Diagnosis not present

## 2023-07-27 DIAGNOSIS — K635 Polyp of colon: Secondary | ICD-10-CM | POA: Diagnosis not present

## 2023-07-27 DIAGNOSIS — R1032 Left lower quadrant pain: Secondary | ICD-10-CM | POA: Diagnosis not present

## 2023-07-27 DIAGNOSIS — I10 Essential (primary) hypertension: Secondary | ICD-10-CM | POA: Insufficient documentation

## 2023-07-27 DIAGNOSIS — K621 Rectal polyp: Secondary | ICD-10-CM | POA: Insufficient documentation

## 2023-07-27 DIAGNOSIS — E119 Type 2 diabetes mellitus without complications: Secondary | ICD-10-CM | POA: Diagnosis not present

## 2023-07-27 DIAGNOSIS — D124 Benign neoplasm of descending colon: Secondary | ICD-10-CM | POA: Insufficient documentation

## 2023-07-27 DIAGNOSIS — G4730 Sleep apnea, unspecified: Secondary | ICD-10-CM | POA: Diagnosis not present

## 2023-07-27 HISTORY — PX: COLONOSCOPY WITH PROPOFOL: SHX5780

## 2023-07-27 HISTORY — DX: Sleep apnea, unspecified: G47.30

## 2023-07-27 HISTORY — PX: POLYPECTOMY: SHX5525

## 2023-07-27 HISTORY — DX: Type 2 diabetes mellitus without complications: E11.9

## 2023-07-27 LAB — GLUCOSE, CAPILLARY: Glucose-Capillary: 132 mg/dL — ABNORMAL HIGH (ref 70–99)

## 2023-07-27 SURGERY — COLONOSCOPY WITH PROPOFOL
Anesthesia: General

## 2023-07-27 MED ORDER — SODIUM CHLORIDE 0.9 % IV SOLN: INTRAVENOUS | Status: DC

## 2023-07-27 MED ORDER — DEXMEDETOMIDINE HCL IN NACL 80 MCG/20ML IV SOLN
INTRAVENOUS | Status: DC | PRN
  Administered 2023-07-27: 4 ug via INTRAVENOUS

## 2023-07-27 MED ORDER — IPRATROPIUM-ALBUTEROL 0.5-2.5 (3) MG/3ML IN SOLN
3.0000 mL | Freq: Once | RESPIRATORY_TRACT | Status: AC
  Administered 2023-07-27: 3 mL via RESPIRATORY_TRACT

## 2023-07-27 MED ORDER — PROPOFOL 10 MG/ML IV BOLUS
INTRAVENOUS | Status: AC
  Filled 2023-07-27: qty 20

## 2023-07-27 MED ORDER — LIDOCAINE HCL (CARDIAC) PF 100 MG/5ML IV SOSY
PREFILLED_SYRINGE | INTRAVENOUS | Status: DC | PRN
  Administered 2023-07-27: 60 mg via INTRAVENOUS

## 2023-07-27 MED ORDER — IPRATROPIUM-ALBUTEROL 0.5-2.5 (3) MG/3ML IN SOLN
RESPIRATORY_TRACT | Status: AC
  Filled 2023-07-27: qty 3

## 2023-07-27 MED ORDER — LIDOCAINE HCL (PF) 2 % IJ SOLN
INTRAMUSCULAR | Status: AC
  Filled 2023-07-27: qty 5

## 2023-07-27 MED ORDER — PROPOFOL 10 MG/ML IV BOLUS
INTRAVENOUS | Status: DC | PRN
  Administered 2023-07-27: 100 mg via INTRAVENOUS
  Administered 2023-07-27: 10 mg via INTRAVENOUS
  Administered 2023-07-27: 20 mg via INTRAVENOUS
  Administered 2023-07-27: 50 mg via INTRAVENOUS
  Administered 2023-07-27 (×2): 20 mg via INTRAVENOUS
  Administered 2023-07-27: 50 mg via INTRAVENOUS
  Administered 2023-07-27: 30 mg via INTRAVENOUS
  Administered 2023-07-27: 50 mg via INTRAVENOUS
  Administered 2023-07-27: 20 mg via INTRAVENOUS
  Administered 2023-07-27: 30 mg via INTRAVENOUS

## 2023-07-27 NOTE — H&P (Signed)
 Pre-Procedure H&P   Patient ID: Erica Schultz is a 73 y.o. female.  Gastroenterology Provider: Jaynie Collins, DO  Referring Provider: Jacob Moores, PA PCP: Marisue Ivan, MD  Date: 07/27/2023  HPI Erica Schultz is a 73 y.o. female who presents today for Colonoscopy for Constipation, left lower quadrant pain, change in bowel habits, bright red blood per rectum.  Patient has had left lower quadrant pain for the last several months.  She has noted worsening constipation with increased bloating and pressure.  She describes this as a dull ache.  She has noticed blood with wiping on the tissue paper but rarely.  No changes in weight or appetite.  Bowel movements occur every 3 days with increased straining  Hemoglobin 12.2 MCV 91 platelets 188,000  Last colonoscopy in [place] 10+ years ago   Past Medical History:  Diagnosis Date   Diabetes mellitus without complication (HCC)    Hypertension    Sleep apnea     Past Surgical History:  Procedure Laterality Date   BREAST CYST ASPIRATION Right 2001   COLONOSCOPY WITH PROPOFOL     UMBILICAL HERNIA REPAIR  2017    Family History No h/o GI disease or malignancy  Review of Systems  Constitutional:  Negative for activity change, appetite change, chills, diaphoresis, fatigue, fever and unexpected weight change.  HENT:  Negative for trouble swallowing and voice change.   Respiratory:  Negative for shortness of breath and wheezing.   Cardiovascular:  Negative for chest pain, palpitations and leg swelling.  Gastrointestinal:  Positive for abdominal pain, anal bleeding and constipation. Negative for abdominal distention, blood in stool, diarrhea, nausea, rectal pain and vomiting.  Musculoskeletal:  Negative for arthralgias and myalgias.  Skin:  Negative for color change and pallor.  Neurological:  Negative for dizziness, syncope and weakness.  Psychiatric/Behavioral:  Negative for confusion.   All other systems  reviewed and are negative.    Medications No current facility-administered medications on file prior to encounter.   Current Outpatient Medications on File Prior to Encounter  Medication Sig Dispense Refill   rosuvastatin (CRESTOR) 5 MG tablet Take 5 mg by mouth daily.     amLODipine (NORVASC) 2.5 MG tablet Take 1 tablet by mouth daily.     atorvastatin (LIPITOR) 40 MG tablet Take 1 tablet by mouth daily. (Patient not taking: Reported on 03/12/2020)     estradiol (ESTRACE) 0.5 MG tablet Take 1 tablet by mouth daily. (Patient not taking: Reported on 03/12/2020)     ferrous sulfate 325 (65 FE) MG EC tablet Take 325 mg by mouth daily with breakfast.     glucosamine-chondroitin 500-400 MG tablet Take by mouth.     latanoprost (XALATAN) 0.005 % ophthalmic solution Apply to eye. (Patient not taking: Reported on 03/12/2020)     metFORMIN (GLUCOPHAGE-XR) 500 MG 24 hr tablet      Omega-3 Fatty Acids (FISH OIL) 1000 MG CAPS Take by mouth.      Pertinent medications related to GI and procedure were reviewed by me with the patient prior to the procedure   Current Facility-Administered Medications:    0.9 %  sodium chloride infusion, , Intravenous, Continuous, Jaynie Collins, DO, Last Rate: 20 mL/hr at 07/27/23 0808, New Bag at 07/27/23 0808  sodium chloride 20 mL/hr at 07/27/23 1610       Allergies  Allergen Reactions   Losartan Cough   Lisinopril Cough   Allergies were reviewed by me prior to the procedure  Objective  Body mass index is 30.95 kg/m. Vitals:   07/27/23 0737 07/27/23 0756  BP:  (!) 149/94  Resp:  18  Temp:  (!) 96.8 F (36 C)  SpO2:  100%  Weight: 84.4 kg   Height: 5\' 5"  (1.651 m)      Physical Exam Vitals and nursing note reviewed.  Constitutional:      General: She is not in acute distress.    Appearance: Normal appearance. She is not ill-appearing, toxic-appearing or diaphoretic.  HENT:     Head: Normocephalic and atraumatic.     Nose: Nose  normal.     Mouth/Throat:     Mouth: Mucous membranes are moist.     Pharynx: Oropharynx is clear.  Eyes:     General: No scleral icterus.    Extraocular Movements: Extraocular movements intact.  Cardiovascular:     Rate and Rhythm: Normal rate and regular rhythm.     Heart sounds: Normal heart sounds. No murmur heard.    No friction rub. No gallop.  Pulmonary:     Effort: Pulmonary effort is normal. No respiratory distress.     Breath sounds: Normal breath sounds. No wheezing, rhonchi or rales.  Abdominal:     General: Bowel sounds are normal. There is no distension.     Palpations: Abdomen is soft.     Tenderness: There is no abdominal tenderness. There is no guarding or rebound.  Musculoskeletal:     Cervical back: Neck supple.     Right lower leg: No edema.     Left lower leg: No edema.  Skin:    General: Skin is warm and dry.     Coloration: Skin is not jaundiced or pale.  Neurological:     General: No focal deficit present.     Mental Status: She is alert and oriented to person, place, and time. Mental status is at baseline.  Psychiatric:        Mood and Affect: Mood normal.        Behavior: Behavior normal.        Thought Content: Thought content normal.        Judgment: Judgment normal.      Assessment:  Erica Schultz is a 73 y.o. female  who presents today for Colonoscopy for Constipation, left lower quadrant pain, change in bowel habits, bright red blood per rectum .  Plan:  Colonoscopy with possible intervention today  Colonoscopy with possible biopsy, control of bleeding, polypectomy, and interventions as necessary has been discussed with the patient/patient representative. Informed consent was obtained from the patient/patient representative after explaining the indication, nature, and risks of the procedure including but not limited to death, bleeding, perforation, missed neoplasm/lesions, cardiorespiratory compromise, and reaction to medications.  Opportunity for questions was given and appropriate answers were provided. Patient/patient representative has verbalized understanding is amenable to undergoing the procedure.   Jaynie Collins, DO  [facility]  Portions of the record may have been created with voice recognition software. Occasional wrong-word or 'sound-a-like' substitutions may have occurred due to the inherent limitations of voice recognition software.  Read the chart carefully and recognize, using context, where substitutions may have occurred.

## 2023-07-27 NOTE — Transfer of Care (Signed)
 Immediate Anesthesia Transfer of Care Note  Patient: Erica Schultz  Procedure(s) Performed: COLONOSCOPY WITH PROPOFOL POLYPECTOMY  Patient Location: Endoscopy Unit  Anesthesia Type:General  Level of Consciousness: drowsy  Airway & Oxygen Therapy: Patient Spontanous Breathing  Post-op Assessment: Report given to RN  Post vital signs: stable  Last Vitals:  Vitals Value Taken Time  BP    Temp 35.7 C 07/27/23 0907  Pulse 95 07/27/23 0907  Resp    SpO2      Last Pain:  Vitals:   07/27/23 0907  TempSrc: Temporal  PainSc: Asleep         Complications: No notable events documented.

## 2023-07-27 NOTE — Op Note (Signed)
 [facility] Patient Name: Artemisa Sladek Procedure Date: 07/27/2023 8:27 AM MRN: [id number] Account #: [id number] Date of Birth: 22-Oct-1950 Admit Type: Outpatient Age: 73 Room: [facility] ENDO ROOM 1 Gender: Female Note Status: Finalized Instrument Name: Nelda Marseille [id number] Procedure:             Colonoscopy Indications:           Abdominal pain in the left lower quadrant,                         Hematochezia, Change in bowel habits Providers:             Trenda Moots, DO Referring MD:          Marisue Ivan (Referring MD) Medicines:             Monitored Anesthesia Care Complications:         No immediate complications. Estimated blood loss:                         Minimal. Procedure:             Pre-Anesthesia Assessment:                        - Prior to the procedure, a History and Physical was                         performed, and patient medications and allergies were                         reviewed. The patient is competent. The risks and                         benefits of the procedure and the sedation options and                         risks were discussed with the patient. All questions                         were answered and informed consent was obtained.                         Patient identification and proposed procedure were                         verified by the physician, the nurse, the anesthetist                         and the technician in the endoscopy suite. Mental                         Status Examination: alert and oriented. Airway                         Examination: normal oropharyngeal airway and neck                         mobility. Respiratory Examination: clear to  auscultation. CV Examination: RRR, no murmurs, no S3                         or S4. Prophylactic Antibiotics: The patient does not                         require prophylactic antibiotics. Prior                          Anticoagulants: The patient has taken no anticoagulant                         or antiplatelet agents. ASA Grade Assessment: II - A                         patient with mild systemic disease. After reviewing                         the risks and benefits, the patient was deemed in                         satisfactory condition to undergo the procedure. The                         anesthesia plan was to use monitored anesthesia care                         (MAC). Immediately prior to administration of                         medications, the patient was re-assessed for adequacy                         to receive sedatives. The heart rate, respiratory                         rate, oxygen saturations, blood pressure, adequacy of                         pulmonary ventilation, and response to care were                         monitored throughout the procedure. The physical                         status of the patient was re-assessed after the                         procedure.                        After obtaining informed consent, the colonoscope was                         passed under direct vision. Throughout the procedure,                         the patient's blood pressure, pulse, and oxygen  saturations were monitored continuously. The                         Colonoscope was introduced through the anus and                         advanced to the the terminal ileum, with                         identification of the appendiceal orifice and IC                         valve. The colonoscopy was performed without                         difficulty. The patient tolerated the procedure well.                         The quality of the bowel preparation was evaluated                         using the BBPS [facility] Bowel Preparation Scale) with                         scores of: Right Colon = 2 (minor amount of residual                         staining, small  fragments of stool and/or opaque                         liquid, but mucosa seen well), Transverse Colon = 3                         (entire mucosa seen well with no residual staining,                         small fragments of stool or opaque liquid) and Left                         Colon = 2 (minor amount of residual staining, small                         fragments of stool and/or opaque liquid, but mucosa                         seen well). The total BBPS score equals 7. The quality                         of the bowel preparation was good. The terminal ileum,                         ileocecal valve, appendiceal orifice, and rectum were                         photographed. Findings:      The perianal and digital rectal examinations were normal. Pertinent       negatives include normal sphincter  tone.      The terminal ileum appeared normal. Estimated blood loss: none.      Retroflexion in the right colon was performed.      Non-bleeding internal hemorrhoids were found during retroflexion. The       hemorrhoids were Grade I (internal hemorrhoids that do not prolapse).       Estimated blood loss: none.      Three sessile polyps were found in the rectum, descending colon and       transverse colon. The polyps were 1 to 2 mm in size. These polyps were       removed with a jumbo cold forceps. Resection and retrieval were       complete. Estimated blood loss was minimal.      A 4 to 5 mm polyp was found in the descending colon. The polyp was       sessile. The polyp was removed with a cold snare. Resection and       retrieval were complete. Estimated blood loss was minimal.      The exam was otherwise without abnormality on direct and retroflexion       views. Impression:            - The examined portion of the ileum was normal.                        - Non-bleeding internal hemorrhoids.                        - Three 1 to 2 mm polyps in the rectum, in the                          descending colon and in the transverse colon, removed                         with a jumbo cold forceps. Resected and retrieved.                        - One 4 to 5 mm polyp in the descending colon, removed                         with a cold snare. Resected and retrieved.                        - The examination was otherwise normal on direct and                         retroflexion views. Recommendation:        - Patient has a contact number available for                         emergencies. The signs and symptoms of potential                         delayed complications were discussed with the patient.                         Return to normal activities tomorrow. Written  discharge instructions were provided to the patient.                        - Discharge patient to home.                        - Resume previous diet.                        - Continue present medications.                        - No ibuprofen, naproxen, or other non-steroidal                         anti-inflammatory drugs for 5 days after polyp removal.                        - Await pathology results.                        - Repeat colonoscopy for surveillance based on                         pathology results.                        - Return to GI office as previously scheduled.                        - The findings and recommendations were discussed with                         the patient.                        - The findings and recommendations were discussed with                         the patient's family. Procedure Code(s):     --- Professional ---                        [phone], Colonoscopy, flexible; with removal of                         tumor(s), polyp(s), or other lesion(s) by snare                         technique                        45380, 59, Colonoscopy, flexible; with biopsy, single                         or multiple Diagnosis Code(s):     --- Professional ---                         K64.0, First degree hemorrhoids                        D12.8, Benign neoplasm of rectum  D12.4, Benign neoplasm of descending colon                        D12.3, Benign neoplasm of transverse colon (hepatic                         flexure or splenic flexure)                        R10.32, Left lower quadrant pain                        K92.1, Melena (includes Hematochezia)                        R19.4, Change in bowel habit CPT copyright 2022 [employer] Association. All rights reserved. The codes documented in this report are preliminary and upon coder review may  be revised to meet current compliance requirements. Attending Participation:      I personally performed the entire procedure. Elfredia Nevins, DO Jaynie Collins DO, DO 07/27/2023 9:10:21 AM This report has been signed electronically. Number of Addenda: 0 Note Initiated On: 07/27/2023 8:27 AM Scope Withdrawal Time: 0 hours 13 minutes 6 seconds  Total Procedure Duration: 0 hours 27 minutes 28 seconds  Estimated Blood Loss:  Estimated blood loss was minimal.      [facility]

## 2023-07-27 NOTE — Anesthesia Postprocedure Evaluation (Signed)
 Anesthesia Post Note  Patient: Erica Schultz  Procedure(s) Performed: COLONOSCOPY WITH PROPOFOL POLYPECTOMY  Patient location during evaluation: PACU Anesthesia Type: General Level of consciousness: awake and awake and alert Pain management: satisfactory to patient Vital Signs Assessment: post-procedure vital signs reviewed and stable Respiratory status: spontaneous breathing and nonlabored ventilation Cardiovascular status: blood pressure returned to baseline Anesthetic complications: no   No notable events documented.   Last Vitals:  Vitals:   07/27/23 0907 07/27/23 0930  BP: (!) 142/71 122/78  Pulse: 95 85  Resp:    Temp: (!) 35.7 C   SpO2:  99%    Last Pain:  Vitals:   07/27/23 0930  TempSrc:   PainSc: 0-No pain                 VAN STAVEREN,Keiry Kowal

## 2023-07-27 NOTE — Anesthesia Preprocedure Evaluation (Signed)
 Anesthesia Evaluation  Patient identified by MRN, date of birth, ID band Patient awake    Reviewed: Allergy & Precautions, NPO status , Patient's Chart, lab work & pertinent test results  Airway Mallampati: II  TM Distance: >3 FB Neck ROM: Full    Dental  (+) Teeth Intact, Caps,    Pulmonary neg pulmonary ROS, sleep apnea    Pulmonary exam normal breath sounds clear to auscultation       Cardiovascular hypertension, Pt. on medications negative cardio ROS Normal cardiovascular exam Rhythm:Regular Rate:Normal     Neuro/Psych negative neurological ROS  negative psych ROS   GI/Hepatic negative GI ROS, Neg liver ROS,,,  Endo/Other  negative endocrine ROSdiabetes, Type 2, Oral Hypoglycemic Agents    Renal/GU negative Renal ROS  negative genitourinary   Musculoskeletal   Abdominal   Peds negative pediatric ROS (+)  Hematology negative hematology ROS (+)   Anesthesia Other Findings Past Medical History: No date: Diabetes mellitus without complication (HCC) No date: Hypertension No date: Sleep apnea  Past Surgical History: 2001: BREAST CYST ASPIRATION; Right No date: COLONOSCOPY WITH PROPOFOL 2017: UMBILICAL HERNIA REPAIR  BMI    Body Mass Index: 30.95 kg/m      Reproductive/Obstetrics negative OB ROS                             Anesthesia Physical Anesthesia Plan  ASA: 2  Anesthesia Plan: General   Post-op Pain Management:    Induction: Intravenous  PONV Risk Score and Plan: Propofol infusion and TIVA  Airway Management Planned: Natural Airway and Nasal Cannula  Additional Equipment:   Intra-op Plan:   Post-operative Plan:   Informed Consent: I have reviewed the patients History and Physical, chart, labs and discussed the procedure including the risks, benefits and alternatives for the proposed anesthesia with the patient or authorized representative who has indicated  his/her understanding and acceptance.     Dental Advisory Given  Plan Discussed with: CRNA  Anesthesia Plan Comments:        Anesthesia Quick Evaluation

## 2023-07-27 NOTE — Interval H&P Note (Signed)
 History and Physical Interval Note: Preprocedure H&P from 07/27/23  was reviewed and there was no interval change after seeing and examining the patient.  Written consent was obtained from the patient after discussion of risks, benefits, and alternatives. Patient has consented to proceed with Colonoscopy with possible intervention   07/27/2023 8:26 AM  Erica Schultz  has presented today for surgery, with the diagnosis of R10.32 (ICD-10-CM) - Abdominal pain, LLQ (left lower quadrant) R19.4 (ICD-10-CM) - Change in bowel habits K59.09 (ICD-10-CM) - Chronic constipation K62.5 (ICD-10-CM) - Rectal bleeding.  The various methods of treatment have been discussed with the patient and family. After consideration of risks, benefits and other options for treatment, the patient has consented to  Procedure(s) with comments: COLONOSCOPY WITH PROPOFOL (N/A) - DM as a surgical intervention.  The patient's history has been reviewed, patient examined, no change in status, stable for surgery.  I have reviewed the patient's chart and labs.  Questions were answered to the patient's satisfaction.     Jaynie Collins

## 2023-07-28 ENCOUNTER — Encounter: Payer: Self-pay | Admitting: Gastroenterology

## 2023-07-28 LAB — SURGICAL PATHOLOGY

## 2023-08-07 DIAGNOSIS — R1032 Left lower quadrant pain: Secondary | ICD-10-CM | POA: Diagnosis not present

## 2023-08-07 DIAGNOSIS — G8929 Other chronic pain: Secondary | ICD-10-CM | POA: Diagnosis not present

## 2023-08-09 ENCOUNTER — Other Ambulatory Visit: Payer: Self-pay | Admitting: Family Medicine

## 2023-08-09 DIAGNOSIS — G8929 Other chronic pain: Secondary | ICD-10-CM

## 2023-08-09 DIAGNOSIS — K4030 Unilateral inguinal hernia, with obstruction, without gangrene, not specified as recurrent: Secondary | ICD-10-CM

## 2023-08-10 ENCOUNTER — Ambulatory Visit
Admission: RE | Admit: 2023-08-10 | Discharge: 2023-08-10 | Disposition: A | Discharge status: Home / Self Care | Source: Ambulatory Visit | Attending: Family Medicine | Admitting: Family Medicine

## 2023-08-10 DIAGNOSIS — R1032 Left lower quadrant pain: Secondary | ICD-10-CM | POA: Insufficient documentation

## 2023-08-10 DIAGNOSIS — G8929 Other chronic pain: Secondary | ICD-10-CM | POA: Insufficient documentation

## 2023-08-10 DIAGNOSIS — K4030 Unilateral inguinal hernia, with obstruction, without gangrene, not specified as recurrent: Secondary | ICD-10-CM | POA: Diagnosis not present

## 2023-08-14 ENCOUNTER — Ambulatory Visit
Admission: RE | Admit: 2023-08-14 | Discharge: 2023-08-14 | Disposition: A | Discharge status: Home / Self Care | Payer: Medicare HMO | Source: Ambulatory Visit | Attending: Family Medicine | Admitting: Family Medicine

## 2023-08-14 DIAGNOSIS — Z1231 Encounter for screening mammogram for malignant neoplasm of breast: Secondary | ICD-10-CM | POA: Insufficient documentation

## 2023-08-16 ENCOUNTER — Encounter

## 2023-08-22 ENCOUNTER — Other Ambulatory Visit: Payer: Self-pay | Admitting: Family Medicine

## 2023-08-22 DIAGNOSIS — R928 Other abnormal and inconclusive findings on diagnostic imaging of breast: Secondary | ICD-10-CM

## 2023-08-23 ENCOUNTER — Ambulatory Visit
Admission: RE | Admit: 2023-08-23 | Discharge: 2023-08-23 | Discharge status: Home / Self Care | Source: Ambulatory Visit | Attending: Family Medicine | Admitting: Family Medicine

## 2023-08-23 ENCOUNTER — Ambulatory Visit
Admission: RE | Admit: 2023-08-23 | Discharge: 2023-08-23 | Disposition: A | Discharge status: Home / Self Care | Source: Ambulatory Visit | Attending: Family Medicine | Admitting: Family Medicine

## 2023-08-23 DIAGNOSIS — R92322 Mammographic fibroglandular density, left breast: Secondary | ICD-10-CM | POA: Diagnosis not present

## 2023-08-23 DIAGNOSIS — R928 Other abnormal and inconclusive findings on diagnostic imaging of breast: Secondary | ICD-10-CM | POA: Diagnosis not present

## 2023-08-30 DIAGNOSIS — G8929 Other chronic pain: Secondary | ICD-10-CM | POA: Diagnosis not present

## 2023-08-30 DIAGNOSIS — R1032 Left lower quadrant pain: Secondary | ICD-10-CM | POA: Diagnosis not present

## 2023-08-31 ENCOUNTER — Other Ambulatory Visit: Payer: Self-pay | Admitting: Family Medicine

## 2023-08-31 DIAGNOSIS — G8929 Other chronic pain: Secondary | ICD-10-CM

## 2023-08-31 DIAGNOSIS — R1032 Left lower quadrant pain: Secondary | ICD-10-CM

## 2023-09-08 ENCOUNTER — Ambulatory Visit
Admission: RE | Admit: 2023-09-08 | Discharge: 2023-09-08 | Disposition: A | Discharge status: Home / Self Care | Source: Ambulatory Visit | Attending: Family Medicine | Admitting: Family Medicine

## 2023-09-08 DIAGNOSIS — D259 Leiomyoma of uterus, unspecified: Secondary | ICD-10-CM | POA: Diagnosis not present

## 2023-09-08 DIAGNOSIS — K429 Umbilical hernia without obstruction or gangrene: Secondary | ICD-10-CM | POA: Diagnosis not present

## 2023-09-08 DIAGNOSIS — R1032 Left lower quadrant pain: Secondary | ICD-10-CM | POA: Diagnosis not present

## 2023-09-08 DIAGNOSIS — G8929 Other chronic pain: Secondary | ICD-10-CM | POA: Diagnosis not present

## 2023-09-08 DIAGNOSIS — N858 Other specified noninflammatory disorders of uterus: Secondary | ICD-10-CM | POA: Diagnosis not present

## 2023-09-08 DIAGNOSIS — N281 Cyst of kidney, acquired: Secondary | ICD-10-CM | POA: Diagnosis not present

## 2023-09-08 LAB — POCT I-STAT, CHEM 8
BUN: 19 mg/dL (ref 8–23)
Calcium, Ion: 1.22 mmol/L (ref 1.15–1.40)
Chloride: 102 mmol/L (ref 98–111)
Creatinine, Ser: 0.7 mg/dL (ref 0.44–1.00)
Glucose, Bld: 122 mg/dL — ABNORMAL HIGH (ref 70–99)
HCT: 38 % (ref 36.0–46.0)
Hemoglobin: 12.9 g/dL (ref 12.0–15.0)
Potassium: 4.1 mmol/L (ref 3.5–5.1)
Sodium: 140 mmol/L (ref 135–145)
TCO2: 28 mmol/L (ref 22–32)

## 2023-09-08 MED ORDER — IOHEXOL 300 MG/ML  SOLN
100.0000 mL | Freq: Once | INTRAMUSCULAR | Status: AC | PRN
  Administered 2023-09-08: 100 mL via INTRAVENOUS

## 2023-11-17 DIAGNOSIS — M9902 Segmental and somatic dysfunction of thoracic region: Secondary | ICD-10-CM | POA: Diagnosis not present

## 2023-11-17 DIAGNOSIS — M9901 Segmental and somatic dysfunction of cervical region: Secondary | ICD-10-CM | POA: Diagnosis not present

## 2023-11-17 DIAGNOSIS — M545 Low back pain, unspecified: Secondary | ICD-10-CM | POA: Diagnosis not present

## 2023-11-17 DIAGNOSIS — M9903 Segmental and somatic dysfunction of lumbar region: Secondary | ICD-10-CM | POA: Diagnosis not present

## 2023-11-17 DIAGNOSIS — M62830 Muscle spasm of back: Secondary | ICD-10-CM | POA: Diagnosis not present

## 2023-12-07 DIAGNOSIS — L299 Pruritus, unspecified: Secondary | ICD-10-CM | POA: Diagnosis not present

## 2023-12-07 DIAGNOSIS — R21 Rash and other nonspecific skin eruption: Secondary | ICD-10-CM | POA: Diagnosis not present

## 2023-12-26 DIAGNOSIS — R42 Dizziness and giddiness: Secondary | ICD-10-CM | POA: Diagnosis not present

## 2023-12-26 DIAGNOSIS — E1140 Type 2 diabetes mellitus with diabetic neuropathy, unspecified: Secondary | ICD-10-CM | POA: Diagnosis not present

## 2023-12-29 DIAGNOSIS — E1169 Type 2 diabetes mellitus with other specified complication: Secondary | ICD-10-CM | POA: Diagnosis not present

## 2023-12-29 DIAGNOSIS — E785 Hyperlipidemia, unspecified: Secondary | ICD-10-CM | POA: Diagnosis not present

## 2023-12-29 DIAGNOSIS — I10 Essential (primary) hypertension: Secondary | ICD-10-CM | POA: Diagnosis not present

## 2023-12-29 DIAGNOSIS — E7800 Pure hypercholesterolemia, unspecified: Secondary | ICD-10-CM | POA: Diagnosis not present

## 2024-01-02 DIAGNOSIS — H40003 Preglaucoma, unspecified, bilateral: Secondary | ICD-10-CM | POA: Diagnosis not present

## 2024-01-09 DIAGNOSIS — Z0100 Encounter for examination of eyes and vision without abnormal findings: Secondary | ICD-10-CM | POA: Diagnosis not present

## 2024-01-09 DIAGNOSIS — H2513 Age-related nuclear cataract, bilateral: Secondary | ICD-10-CM | POA: Diagnosis not present

## 2024-01-09 DIAGNOSIS — H43813 Vitreous degeneration, bilateral: Secondary | ICD-10-CM | POA: Diagnosis not present

## 2024-01-09 DIAGNOSIS — E119 Type 2 diabetes mellitus without complications: Secondary | ICD-10-CM | POA: Diagnosis not present

## 2024-01-12 DIAGNOSIS — E785 Hyperlipidemia, unspecified: Secondary | ICD-10-CM | POA: Diagnosis not present

## 2024-01-12 DIAGNOSIS — D72819 Decreased white blood cell count, unspecified: Secondary | ICD-10-CM | POA: Diagnosis not present

## 2024-01-12 DIAGNOSIS — Z1159 Encounter for screening for other viral diseases: Secondary | ICD-10-CM | POA: Diagnosis not present

## 2024-01-12 DIAGNOSIS — Z1331 Encounter for screening for depression: Secondary | ICD-10-CM | POA: Diagnosis not present

## 2024-01-12 DIAGNOSIS — E119 Type 2 diabetes mellitus without complications: Secondary | ICD-10-CM | POA: Diagnosis not present

## 2024-01-12 DIAGNOSIS — I10 Essential (primary) hypertension: Secondary | ICD-10-CM | POA: Diagnosis not present

## 2024-01-12 DIAGNOSIS — Z0000 Encounter for general adult medical examination without abnormal findings: Secondary | ICD-10-CM | POA: Diagnosis not present

## 2024-01-15 DIAGNOSIS — M79672 Pain in left foot: Secondary | ICD-10-CM | POA: Diagnosis not present

## 2024-01-15 DIAGNOSIS — E1142 Type 2 diabetes mellitus with diabetic polyneuropathy: Secondary | ICD-10-CM | POA: Diagnosis not present

## 2024-01-15 DIAGNOSIS — M65872 Other synovitis and tenosynovitis, left ankle and foot: Secondary | ICD-10-CM | POA: Diagnosis not present

## 2024-01-24 DIAGNOSIS — Z780 Asymptomatic menopausal state: Secondary | ICD-10-CM | POA: Diagnosis not present

## 2024-04-22 DIAGNOSIS — E7800 Pure hypercholesterolemia, unspecified: Secondary | ICD-10-CM | POA: Diagnosis not present

## 2024-04-22 DIAGNOSIS — E785 Hyperlipidemia, unspecified: Secondary | ICD-10-CM | POA: Diagnosis not present

## 2024-04-22 DIAGNOSIS — E1169 Type 2 diabetes mellitus with other specified complication: Secondary | ICD-10-CM | POA: Diagnosis not present

## 2024-04-22 DIAGNOSIS — D72819 Decreased white blood cell count, unspecified: Secondary | ICD-10-CM | POA: Diagnosis not present

## 2024-04-29 DIAGNOSIS — E119 Type 2 diabetes mellitus without complications: Secondary | ICD-10-CM | POA: Diagnosis not present

## 2024-04-29 DIAGNOSIS — E6609 Other obesity due to excess calories: Secondary | ICD-10-CM | POA: Diagnosis not present

## 2024-04-29 DIAGNOSIS — E7800 Pure hypercholesterolemia, unspecified: Secondary | ICD-10-CM | POA: Diagnosis not present

## 2024-04-29 DIAGNOSIS — E66811 Obesity, class 1: Secondary | ICD-10-CM | POA: Diagnosis not present

## 2024-04-29 DIAGNOSIS — Z6830 Body mass index (BMI) 30.0-30.9, adult: Secondary | ICD-10-CM | POA: Diagnosis not present

## 2024-04-29 DIAGNOSIS — D72819 Decreased white blood cell count, unspecified: Secondary | ICD-10-CM | POA: Diagnosis not present

## 2024-04-29 DIAGNOSIS — I10 Essential (primary) hypertension: Secondary | ICD-10-CM | POA: Diagnosis not present

## 2024-04-29 DIAGNOSIS — Z862 Personal history of diseases of the blood and blood-forming organs and certain disorders involving the immune mechanism: Secondary | ICD-10-CM | POA: Diagnosis not present

## 2024-05-09 ENCOUNTER — Encounter: Payer: Self-pay | Admitting: Oncology

## 2024-05-09 ENCOUNTER — Inpatient Hospital Stay: Attending: Oncology | Admitting: Oncology

## 2024-05-09 ENCOUNTER — Inpatient Hospital Stay

## 2024-05-09 VITALS — BP 144/83 | HR 87 | Resp 18 | Wt 184.0 lb

## 2024-05-09 DIAGNOSIS — D72819 Decreased white blood cell count, unspecified: Secondary | ICD-10-CM | POA: Diagnosis present

## 2024-05-09 DIAGNOSIS — D649 Anemia, unspecified: Secondary | ICD-10-CM | POA: Diagnosis not present

## 2024-05-09 LAB — VITAMIN B12: Vitamin B-12: 3634 pg/mL — ABNORMAL HIGH (ref 180–914)

## 2024-05-09 LAB — IRON AND TIBC
Iron: 48 ug/dL (ref 28–170)
Saturation Ratios: 15 % (ref 10.4–31.8)
TIBC: 329 ug/dL (ref 250–450)
UIBC: 281 ug/dL

## 2024-05-09 LAB — CBC (CANCER CENTER ONLY)
HCT: 36.2 % (ref 36.0–46.0)
Hemoglobin: 11.6 g/dL — ABNORMAL LOW (ref 12.0–15.0)
MCH: 28.9 pg (ref 26.0–34.0)
MCHC: 32 g/dL (ref 30.0–36.0)
MCV: 90 fL (ref 80.0–100.0)
Platelet Count: 205 K/uL (ref 150–400)
RBC: 4.02 MIL/uL (ref 3.87–5.11)
RDW: 13.6 % (ref 11.5–15.5)
WBC Count: 4.1 K/uL (ref 4.0–10.5)
nRBC: 0 % (ref 0.0–0.2)

## 2024-05-09 LAB — FERRITIN: Ferritin: 325 ng/mL — ABNORMAL HIGH (ref 11–307)

## 2024-05-09 LAB — FOLATE: Folate: >20 ng/mL (ref >5.9)

## 2024-05-09 NOTE — Progress Notes (Signed)
 [facility]  Telephone:(336) [phone] Fax:(336) [phone]  ID: Erica Schultz OB: 03/31/1951  MR#: [id number]  RDW#:[id number]  Patient Care Team: Alla Amis, MD as PCP - General (Family Medicine) Jacobo Erica PARAS, MD as Consulting Physician (Oncology)  CHIEF COMPLAINT: Leukopenia.  INTERVAL HISTORY: Patient is a 73 year old female who is noted to have slowly decreasing total white blood cell count on routine blood work.  She is anxious, but otherwise feels well.  She has no neurological plaints.  She denies any recent fevers or illnesses.  She has no new medications.  She has good appetite and denies weight loss.  She has no chest pain, shortness of breath, cough, or hemoptysis.  She denies any nausea, vomiting, constipation, or diarrhea.  She has no urinary complaints.  Patient offers no specific complaints today.  REVIEW OF SYSTEMS:   Review of Systems  Constitutional: Negative.  Negative for fever, malaise/fatigue and weight loss.  Respiratory: Negative.  Negative for cough, hemoptysis and shortness of breath.   Cardiovascular: Negative.  Negative for chest pain and leg swelling.  Gastrointestinal: Negative.  Negative for abdominal pain.  Genitourinary: Negative.  Negative for dysuria.  Musculoskeletal: Negative.  Negative for back pain.  Skin: Negative.  Negative for rash.  Neurological: Negative.  Negative for dizziness, focal weakness, weakness and headaches.  Psychiatric/Behavioral:  The patient is nervous/anxious.     As per HPI. Otherwise, a complete review of systems is negative.  PAST MEDICAL HISTORY: Past Medical History:  Diagnosis Date   Diabetes mellitus without complication (HCC)    Hypertension    Sleep apnea     PAST SURGICAL HISTORY: Past Surgical History:  Procedure Laterality Date   BREAST CYST ASPIRATION Right 2001   COLONOSCOPY WITH PROPOFOL      COLONOSCOPY WITH PROPOFOL  N/A 07/27/2023   Procedure: COLONOSCOPY WITH  PROPOFOL ;  Surgeon: Onita Elspeth Sharper, DO;  Location[facility] ENDOSCOPY;  Service: Gastroenterology;  Laterality: N/A;  DM   POLYPECTOMY  07/27/2023   Procedure: POLYPECTOMY;  Surgeon: Onita Elspeth Sharper, DO;  Location: [facility] ENDOSCOPY;  Service: Gastroenterology;;   UMBILICAL HERNIA REPAIR  2017    FAMILY HISTORY: Family History  Problem Relation Age of Onset   Breast cancer Neg Hx     ADVANCED DIRECTIVES (Y/N):  N  HEALTH MAINTENANCE: Social History[1]   Colonoscopy:  PAP:  Bone density:  Lipid panel:  Allergies[2]  Current Outpatient Medications  Medication Sig Dispense Refill   amLODipine (NORVASC) 2.5 MG tablet Take 1 tablet by mouth daily.     Coenzyme Q10 (COQ10) 100 MG CAPS      ferrous sulfate 325 (65 FE) MG EC tablet Take 325 mg by mouth daily with breakfast.     glucosamine-chondroitin 500-400 MG tablet Take by mouth.     metFORMIN (GLUCOPHAGE-XR) 500 MG 24 hr tablet      Omega-3 Fatty Acids (FISH OIL) 1000 MG CAPS Take by mouth.     rosuvastatin (CRESTOR) 5 MG tablet Take 5 mg by mouth daily.     No current facility-administered medications for this visit.    OBJECTIVE: Vitals:   05/09/24 1520  BP: (!) 144/83  Pulse: 87  Resp: 18  SpO2: 100%     Body mass index is 30.62 kg/m.    ECOG FS:0 - Asymptomatic  General: Well-developed, well-nourished, no acute distress. Eyes: Pink conjunctiva, anicteric sclera. HEENT: Normocephalic, moist mucous membranes. Lungs: No audible wheezing or coughing. Heart: Regular rate and rhythm. Abdomen: Soft, nontender, no obvious distention. Musculoskeletal:  No edema, cyanosis, or clubbing. Neuro: Alert, answering all questions appropriately. Cranial nerves grossly intact. Skin: No rashes or petechiae noted. Psych: Normal affect. Lymphatics: No cervical, calvicular, axillary or inguinal LAD.   LAB RESULTS:  Lab Results  Component Value Date   NA 140 09/08/2023   K 4.1 09/08/2023   CL 102 09/08/2023   GLUCOSE  122 (H) 09/08/2023   BUN 19 09/08/2023   CREATININE 0.70 09/08/2023    Lab Results  Component Value Date   WBC 4.1 05/09/2024   HGB 11.6 (L) 05/09/2024   HCT 36.2 05/09/2024   MCV 90.0 05/09/2024   PLT 205 05/09/2024     STUDIES: No results found.  ASSESSMENT: Leukopenia.  PLAN:    Leukopenia: Patient previous noted to have slightly decreased white blood cell count, but today's value is within normal limits at 4.1.  All of her laboratory work including SPEP, peripheral blood flow cytometry, and myeloid NGS are pending at time of dictation.  No intervention is needed.  Patient does not require bone marrow biopsy.  Return to clinic in 1 month for further evaluation and discussion of the results. Anemia: Patient's hemoglobin is mildly decreased at 11.6.  Iron panel, B12, folate are pending at time of dictation.  Follow-up as above.  I spent a total of 45 minutes reviewing chart data, face-to-face evaluation with the patient, counseling and coordination of care as detailed above.   Patient expressed understanding and was in agreement with this plan. She also understands that She can call clinic at any time with any questions, concerns, or complaints.    Erica JINNY Reusing, MD   05/09/2024 5:12 PM         [1]  Social History Tobacco Use   Smoking status: Never   Smokeless tobacco: Never  Vaping Use   Vaping status: Never Used  Substance Use Topics   Alcohol use: Never   Drug use: Never  [2]  Allergies Allergen Reactions   Losartan Cough   Lisinopril Cough

## 2024-05-11 LAB — PROTEIN ELECTROPHORESIS, SERUM
A/G Ratio: 1.3 (ref 0.7–1.7)
Albumin ELP: 4 g/dL (ref 2.9–4.4)
Alpha-1-Globulin: 0.2 g/dL (ref 0.0–0.4)
Alpha-2-Globulin: 0.9 g/dL (ref 0.4–1.0)
Beta Globulin: 1.1 g/dL (ref 0.7–1.3)
Gamma Globulin: 0.9 g/dL (ref 0.4–1.8)
Globulin, Total: 3.2 g/dL (ref 2.2–3.9)
Total Protein ELP: 7.2 g/dL (ref 6.0–8.5)

## 2024-05-13 LAB — COMP PANEL: LEUKEMIA/LYMPHOMA

## 2024-05-17 LAB — MYELOID NGS

## 2024-06-11 ENCOUNTER — Encounter: Payer: Self-pay | Admitting: Oncology

## 2024-06-11 ENCOUNTER — Inpatient Hospital Stay: Attending: Oncology | Admitting: Oncology

## 2024-06-11 VITALS — BP 142/84 | HR 88 | Temp 97.8°F | Resp 18 | Wt 185.9 lb

## 2024-06-11 DIAGNOSIS — R7989 Other specified abnormal findings of blood chemistry: Secondary | ICD-10-CM | POA: Insufficient documentation

## 2024-06-11 DIAGNOSIS — D72819 Decreased white blood cell count, unspecified: Secondary | ICD-10-CM | POA: Diagnosis not present

## 2024-06-11 DIAGNOSIS — D649 Anemia, unspecified: Secondary | ICD-10-CM | POA: Diagnosis not present

## 2024-06-11 NOTE — Progress Notes (Unsigned)
 " [facility]  [phone] Fax:([phone]  ID: Christobal Kerns OB: [birth year]  MR#: [id number]  RDW#:[id number]  Patient Care Team: Alla Amis, MD as PCP - General (Family Medicine) Jacobo Evalene PARAS, MD as Consulting Physician (Oncology)  CHIEF COMPLAINT: Leukopenia.  INTERVAL HISTORY: Patient returns to clinic today for further evaluation and discussion of her laboratory results.  She continues to be anxious, but otherwise feels well.  She has no neurologic complaints.  She denies any recent fevers or illnesses.  She has good appetite and denies weight loss.  She has no chest pain, shortness of breath, cough, or hemoptysis.  She denies any nausea, vomiting, constipation, or diarrhea.  She has no urinary complaints.  Patient offers no further specific complaints today.  REVIEW OF SYSTEMS:   Review of Systems  Constitutional: Negative.  Negative for fever, malaise/fatigue and weight loss.  Respiratory: Negative.  Negative for cough, hemoptysis and shortness of breath.   Cardiovascular: Negative.  Negative for chest pain and leg swelling.  Gastrointestinal: Negative.  Negative for abdominal pain.  Genitourinary: Negative.  Negative for dysuria.  Musculoskeletal: Negative.  Negative for back pain.  Skin: Negative.  Negative for rash.  Neurological: Negative.  Negative for dizziness, focal weakness, weakness and headaches.  Psychiatric/Behavioral:  The patient is nervous/anxious.     As per HPI. Otherwise, a complete review of systems is negative.  PAST MEDICAL HISTORY: Past Medical History:  Diagnosis Date   Diabetes mellitus without complication (HCC)    Hypertension    Sleep apnea     PAST SURGICAL HISTORY: Past Surgical History:  Procedure Laterality Date   BREAST CYST ASPIRATION Right 2001   COLONOSCOPY WITH PROPOFOL      COLONOSCOPY WITH PROPOFOL  N/A 07/27/2023   Procedure: COLONOSCOPY WITH PROPOFOL ;  Surgeon: Onita Elspeth Sharper, DO;  Location: [facility] ENDOSCOPY;  Service: Gastroenterology;  Laterality: N/A;  DM   POLYPECTOMY  07/27/2023   Procedure: POLYPECTOMY;  Surgeon: Onita Elspeth Sharper, DO;  Location: [facility] ENDOSCOPY;  Service: Gastroenterology;;   UMBILICAL HERNIA REPAIR  2017    FAMILY HISTORY: Family History  Problem Relation Age of Onset   Breast cancer Neg Hx     ADVANCED DIRECTIVES (Y/N):  N  HEALTH MAINTENANCE: Social History[1]   Colonoscopy:  PAP:  Bone density:  Lipid panel:  Allergies[2]  Current Outpatient Medications  Medication Sig Dispense Refill   amLODipine (NORVASC) 2.5 MG tablet Take 1 tablet by mouth daily.     Coenzyme Q10 (COQ10) 100 MG CAPS      ferrous sulfate 325 (65 FE) MG EC tablet Take 325 mg by mouth daily with breakfast.     glucosamine-chondroitin 500-400 MG tablet Take by mouth.     metFORMIN (GLUCOPHAGE-XR) 500 MG 24 hr tablet      Omega-3 Fatty Acids (FISH OIL) 1000 MG CAPS Take by mouth.     rosuvastatin (CRESTOR) 5 MG tablet Take 5 mg by mouth daily.     No current facility-administered medications for this visit.    OBJECTIVE: Vitals:   06/11/24 1544  BP: (!) 142/84  Pulse: 88  Resp: 18  Temp: 97.8 F (36.6 C)  SpO2: 100%     Body mass index is 30.94 kg/m.    ECOG FS:0 - Asymptomatic  General: Well-developed, well-nourished, no acute distress. Eyes: Pink conjunctiva, anicteric sclera. HEENT: Normocephalic, moist mucous membranes. Lungs: No audible wheezing or coughing. Heart: Regular rate and rhythm. Abdomen: Soft, nontender, no obvious distention. Musculoskeletal: No edema, cyanosis,  or clubbing. Neuro: Alert, answering all questions appropriately. Cranial nerves grossly intact. Skin: No rashes or petechiae noted. Psych: Normal affect.  LAB RESULTS:  Lab Results  Component Value Date   NA 140 09/08/2023   K 4.1 09/08/2023   CL 102 09/08/2023   GLUCOSE 122 (H) 09/08/2023   BUN 19 09/08/2023   CREATININE 0.70 09/08/2023     Lab Results  Component Value Date   WBC 4.1 05/09/2024   HGB 11.6 (L) 05/09/2024   HCT 36.2 05/09/2024   MCV 90.0 05/09/2024   PLT 205 05/09/2024     STUDIES: No results found.  ASSESSMENT: Leukopenia.  PLAN:    Leukopenia: Patient previous noted to have slightly decreased white blood cell count, but her most recent result is within normal limits at 4.1.  All available laboratory work including iron stores, B12, folate, SPEP, flow cytometry are either negative or within normal limits.  Myeloid NGS panel did reveal a variant with strong clinical significance, but within normal CBC the actual clinical significance of this is unclear.  No intervention is needed.  Patient does not require bone marrow biopsy.  No further follow-up has been scheduled.  Anemia: Patient's hemoglobin is mildly decreased at 11.6.  Laboratory work as above. Elevated B12 levels: Patient admits she recently doubled her oral dose of B12.      Patient expressed understanding and was in agreement with this plan. She also understands that She can call clinic at any time with any questions, concerns, or complaints.    [name], MD   06/12/2024 1:02 PM          [1]  Social History Tobacco Use   Smoking status: Never   Smokeless tobacco: Never  Vaping Use   Vaping status: Never Used  Substance Use Topics   Alcohol use: Never   Drug use: Never  [2]  Allergies Allergen Reactions   Losartan Cough   Lisinopril Cough   "
